# Patient Record
Sex: Female | Born: 1945 | Race: White | Hispanic: No | Marital: Married | Smoking: Former smoker
Health system: Southern US, Community
[De-identification: ages and names within clinical notes are randomized; demographics above are authoritative.]

## PROBLEM LIST (undated history)

## (undated) DIAGNOSIS — Z8619 Personal history of other infectious and parasitic diseases: Secondary | ICD-10-CM

## (undated) DIAGNOSIS — I340 Nonrheumatic mitral (valve) insufficiency: Secondary | ICD-10-CM

## (undated) DIAGNOSIS — Z8719 Personal history of other diseases of the digestive system: Secondary | ICD-10-CM

## (undated) DIAGNOSIS — J449 Chronic obstructive pulmonary disease, unspecified: Secondary | ICD-10-CM

## (undated) DIAGNOSIS — K219 Gastro-esophageal reflux disease without esophagitis: Secondary | ICD-10-CM

## (undated) DIAGNOSIS — I447 Left bundle-branch block, unspecified: Secondary | ICD-10-CM

## (undated) DIAGNOSIS — K5790 Diverticulosis of intestine, part unspecified, without perforation or abscess without bleeding: Secondary | ICD-10-CM

## (undated) DIAGNOSIS — E785 Hyperlipidemia, unspecified: Secondary | ICD-10-CM

## (undated) DIAGNOSIS — D649 Anemia, unspecified: Secondary | ICD-10-CM

## (undated) DIAGNOSIS — K649 Unspecified hemorrhoids: Secondary | ICD-10-CM

## (undated) DIAGNOSIS — M1990 Unspecified osteoarthritis, unspecified site: Secondary | ICD-10-CM

## (undated) DIAGNOSIS — I429 Cardiomyopathy, unspecified: Secondary | ICD-10-CM

## (undated) DIAGNOSIS — F329 Major depressive disorder, single episode, unspecified: Secondary | ICD-10-CM

## (undated) DIAGNOSIS — I341 Nonrheumatic mitral (valve) prolapse: Secondary | ICD-10-CM

## (undated) DIAGNOSIS — A0472 Enterocolitis due to Clostridium difficile, not specified as recurrent: Secondary | ICD-10-CM

## (undated) DIAGNOSIS — F32A Depression, unspecified: Secondary | ICD-10-CM

## (undated) DIAGNOSIS — R091 Pleurisy: Secondary | ICD-10-CM

## (undated) DIAGNOSIS — I219 Acute myocardial infarction, unspecified: Secondary | ICD-10-CM

## (undated) DIAGNOSIS — I639 Cerebral infarction, unspecified: Secondary | ICD-10-CM

## (undated) DIAGNOSIS — K2960 Other gastritis without bleeding: Secondary | ICD-10-CM

## (undated) HISTORY — PX: DILATION AND CURETTAGE OF UTERUS: SHX78

## (undated) HISTORY — PX: CHOLECYSTECTOMY: SHX55

## (undated) HISTORY — PX: BREAST SURGERY: SHX581

## (undated) HISTORY — PX: COLONOSCOPY: SHX174

## (undated) SURGERY — Surgical Case
Anesthesia: *Unknown

---

## 1994-11-09 DIAGNOSIS — A0472 Enterocolitis due to Clostridium difficile, not specified as recurrent: Secondary | ICD-10-CM

## 1994-11-09 HISTORY — DX: Enterocolitis due to Clostridium difficile, not specified as recurrent: A04.72

## 2004-08-12 ENCOUNTER — Emergency Department: Payer: Self-pay | Admitting: Emergency Medicine

## 2004-08-12 ENCOUNTER — Other Ambulatory Visit: Payer: Self-pay

## 2004-08-13 ENCOUNTER — Ambulatory Visit: Payer: Self-pay | Admitting: Emergency Medicine

## 2004-08-27 ENCOUNTER — Ambulatory Visit: Payer: Self-pay | Admitting: Surgery

## 2005-07-31 ENCOUNTER — Ambulatory Visit: Payer: Self-pay | Admitting: Internal Medicine

## 2005-12-19 ENCOUNTER — Other Ambulatory Visit: Payer: Self-pay

## 2005-12-19 ENCOUNTER — Emergency Department: Payer: Self-pay | Admitting: Emergency Medicine

## 2005-12-21 ENCOUNTER — Ambulatory Visit: Payer: Self-pay | Admitting: Emergency Medicine

## 2006-02-18 ENCOUNTER — Ambulatory Visit: Payer: Self-pay

## 2007-09-29 ENCOUNTER — Ambulatory Visit: Payer: Self-pay | Admitting: Gastroenterology

## 2007-10-17 ENCOUNTER — Emergency Department: Payer: Self-pay | Admitting: Emergency Medicine

## 2007-10-17 ENCOUNTER — Other Ambulatory Visit: Payer: Self-pay

## 2007-11-08 ENCOUNTER — Ambulatory Visit: Payer: Self-pay | Admitting: Gastroenterology

## 2007-12-12 ENCOUNTER — Ambulatory Visit: Payer: Self-pay | Admitting: Internal Medicine

## 2007-12-22 ENCOUNTER — Ambulatory Visit: Payer: Self-pay | Admitting: Surgery

## 2008-12-16 IMAGING — RF DG UGI W/O KUB
1 series · 15 of 24 positions shown · non-contrast
Comparison: none

REASON FOR EXAM: reflux    eval hiatal hernia
COMMENTS:

[Series 1: run · 15 of 29 slices shown]
[im 1/29]
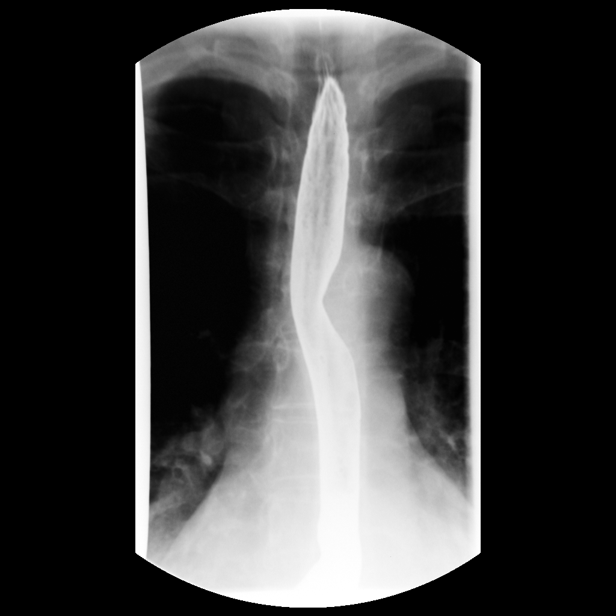
[im 3/29]
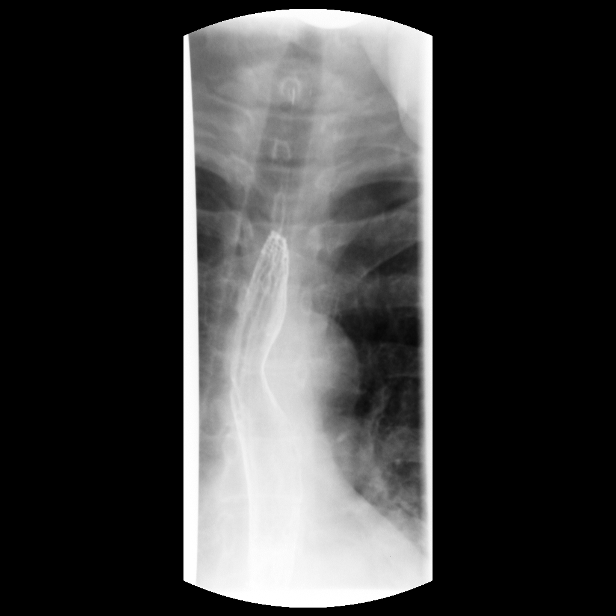
[im 5/29]
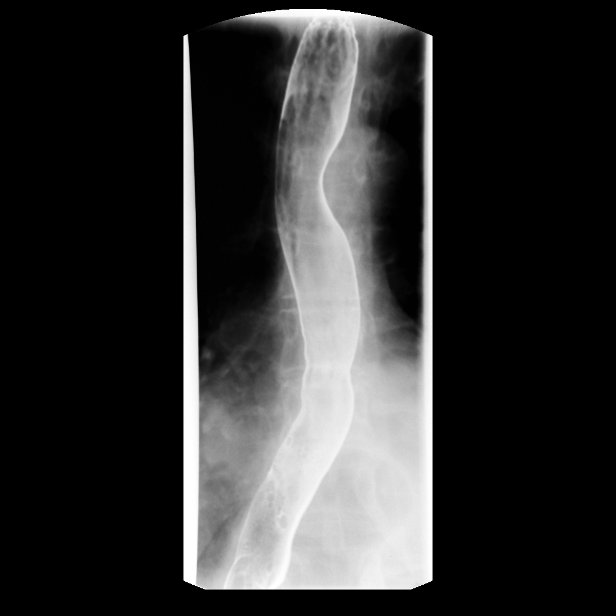
[im 7/29]
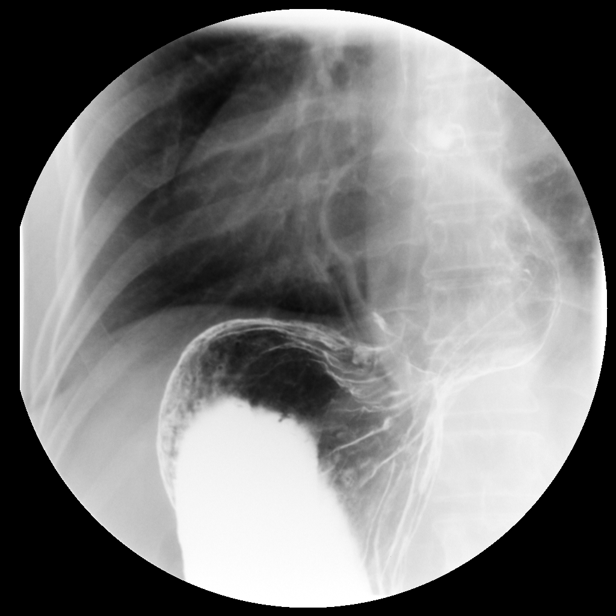
[im 9/29]
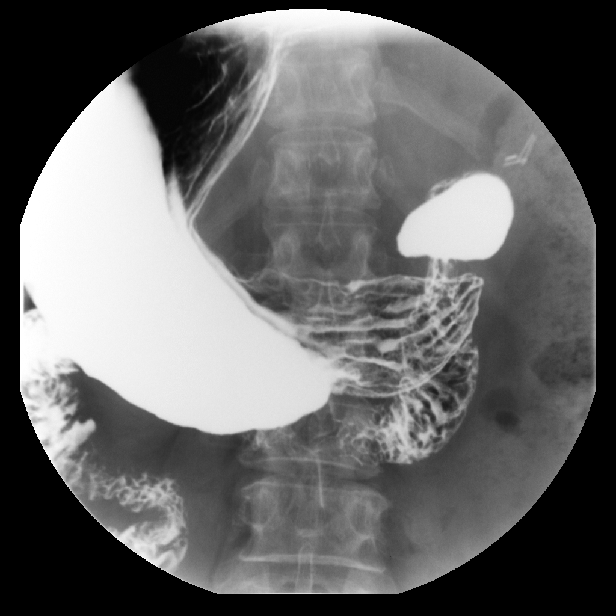
[im 10/29]
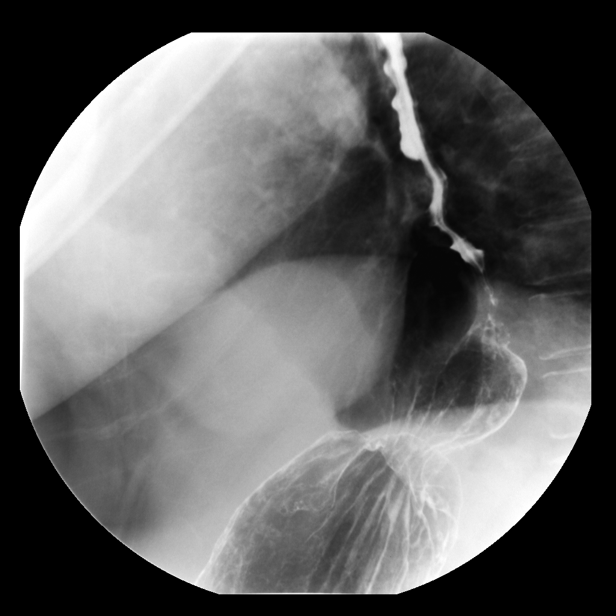
[im 13/29]
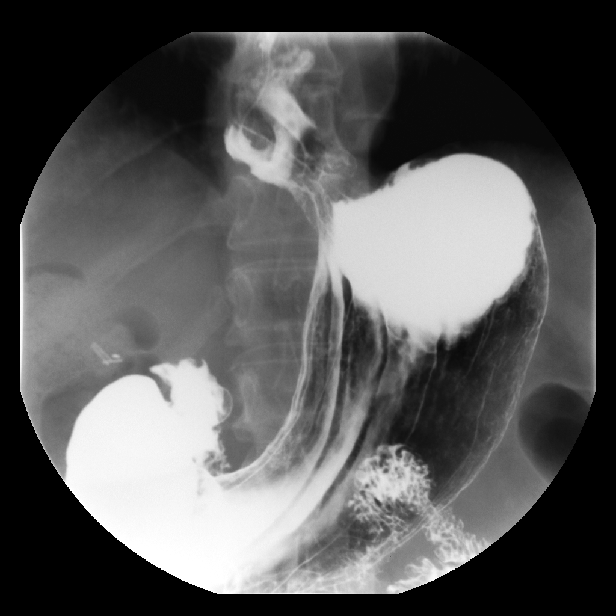
[im 15/29]
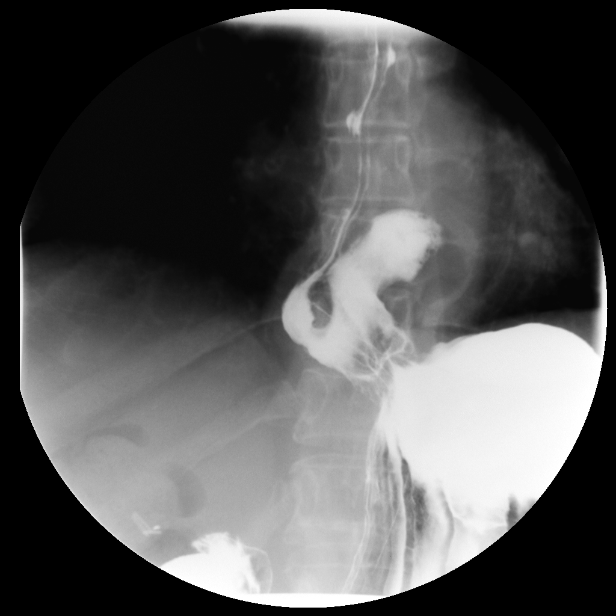
[im 16/29]
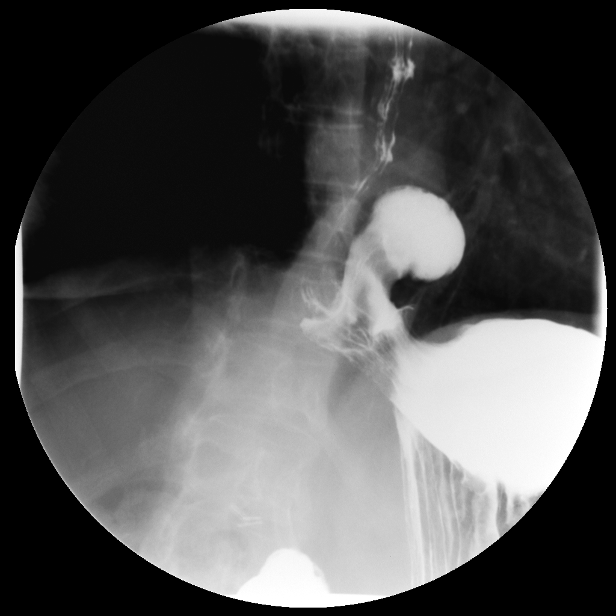
[im 19/29]
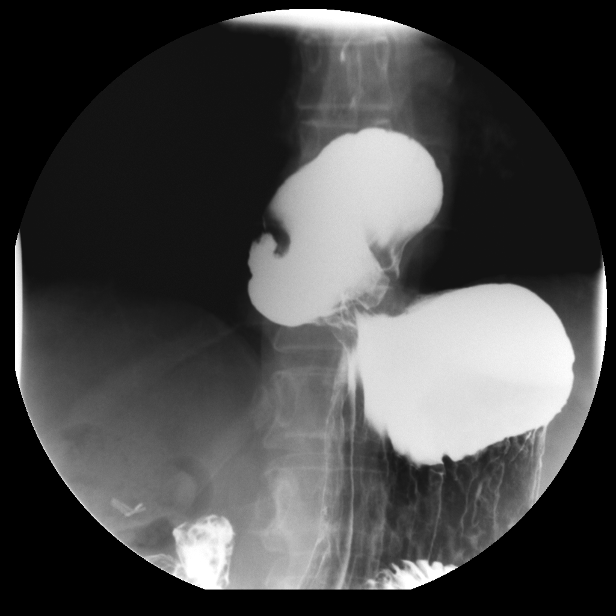
[im 20/29]
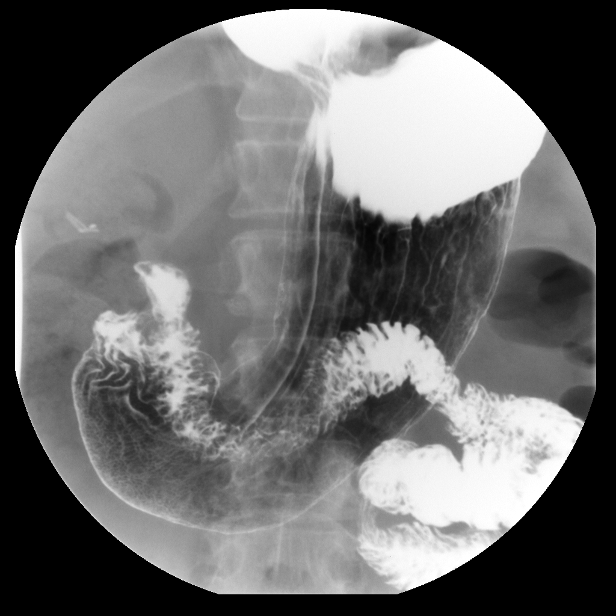
[im 22/29]
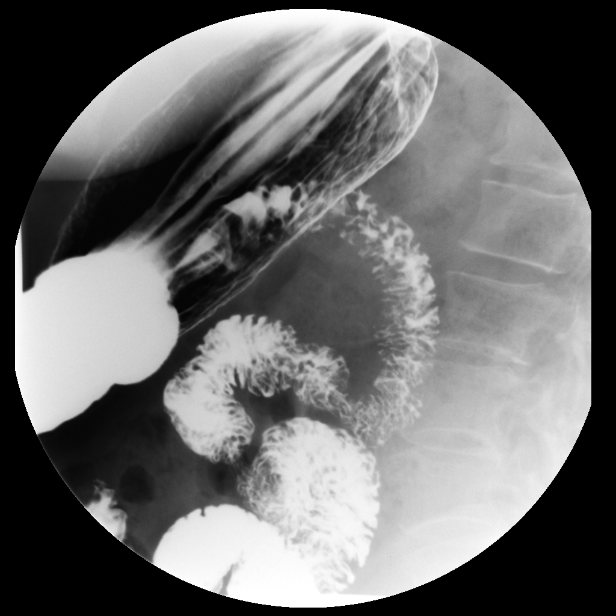
[im 25/29]
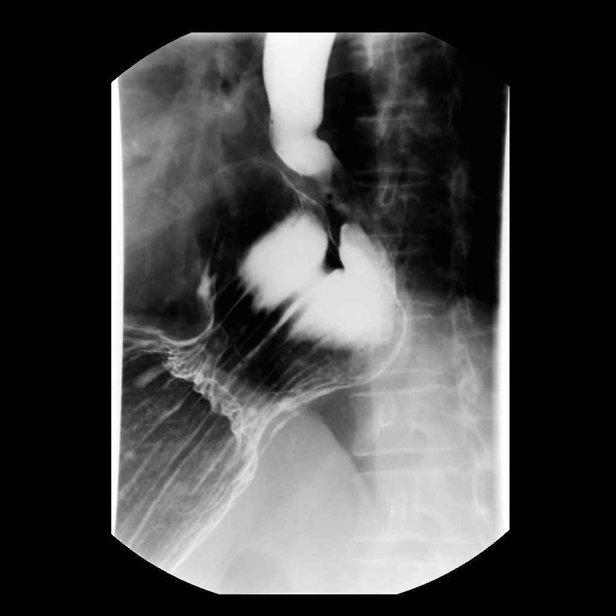
[im 26/29]
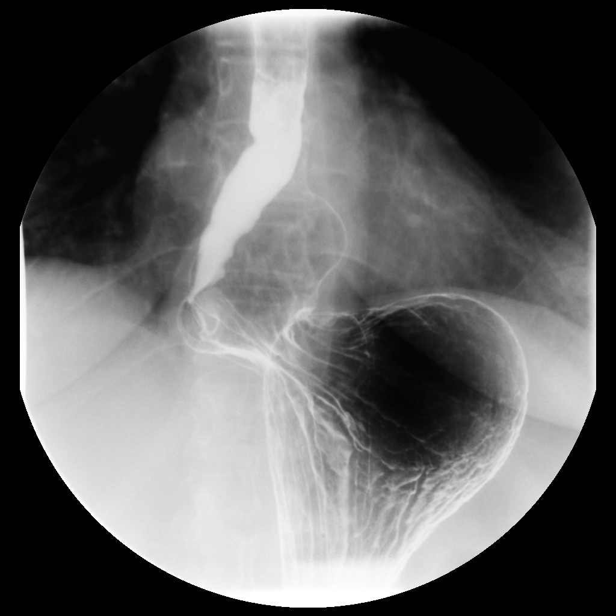
[im 29/29]
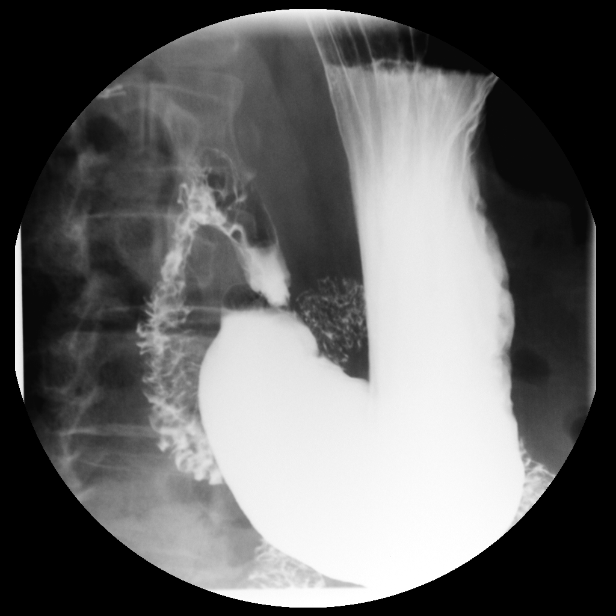

[15 of 24 positions shown; findings below may reference images not displayed]

PROCEDURE:     FL  - FL UPPER GI  - December 22, 2007  [DATE]

RESULT:     Double-contrast upper GI examination is performed. The patient
has had a recent barium swallow exam which demonstrated a paraesophageal
hernia. Today's examination again demonstrates herniation of the fundus
through the diaphragmatic hiatus into a paraesophageal location. There is
minimal gastroesophageal reflux into the distal esophagus. The gastric
mucosa and esophageal mucosa appear to be normal. The proximal small bowel
is unremarkable.
IMPRESSION: Paraesophageal hernia as described previously.

## 2009-11-17 ENCOUNTER — Emergency Department: Payer: Self-pay | Admitting: Emergency Medicine

## 2009-11-21 ENCOUNTER — Ambulatory Visit: Payer: Self-pay | Admitting: Gastroenterology

## 2009-11-25 ENCOUNTER — Emergency Department: Payer: Self-pay

## 2010-04-29 ENCOUNTER — Ambulatory Visit: Payer: Self-pay

## 2010-05-06 ENCOUNTER — Emergency Department: Payer: Self-pay | Admitting: Emergency Medicine

## 2010-07-24 ENCOUNTER — Ambulatory Visit: Payer: Self-pay | Admitting: Neurology

## 2011-01-02 ENCOUNTER — Emergency Department: Payer: Self-pay | Admitting: Emergency Medicine

## 2011-05-01 ENCOUNTER — Ambulatory Visit: Payer: Self-pay | Admitting: Rheumatology

## 2011-05-01 IMAGING — CR DG CHEST 2V
1 series · 2 of 2 positions shown · non-contrast
Comparison: none

REASON FOR EXAM: SOB
COMMENTS:

PROCEDURE:     DXR - DXR CHEST PA (OR AP) AND LATERAL  - May 06, 2010  [DATE]
RESULT:     No acute cardiopulmonary disease noted. Sliding hiatal hernia is
present.

[Series 1: view not recorded · 0.17mm/px · 2 of 2 slices shown]
[im 1/2]
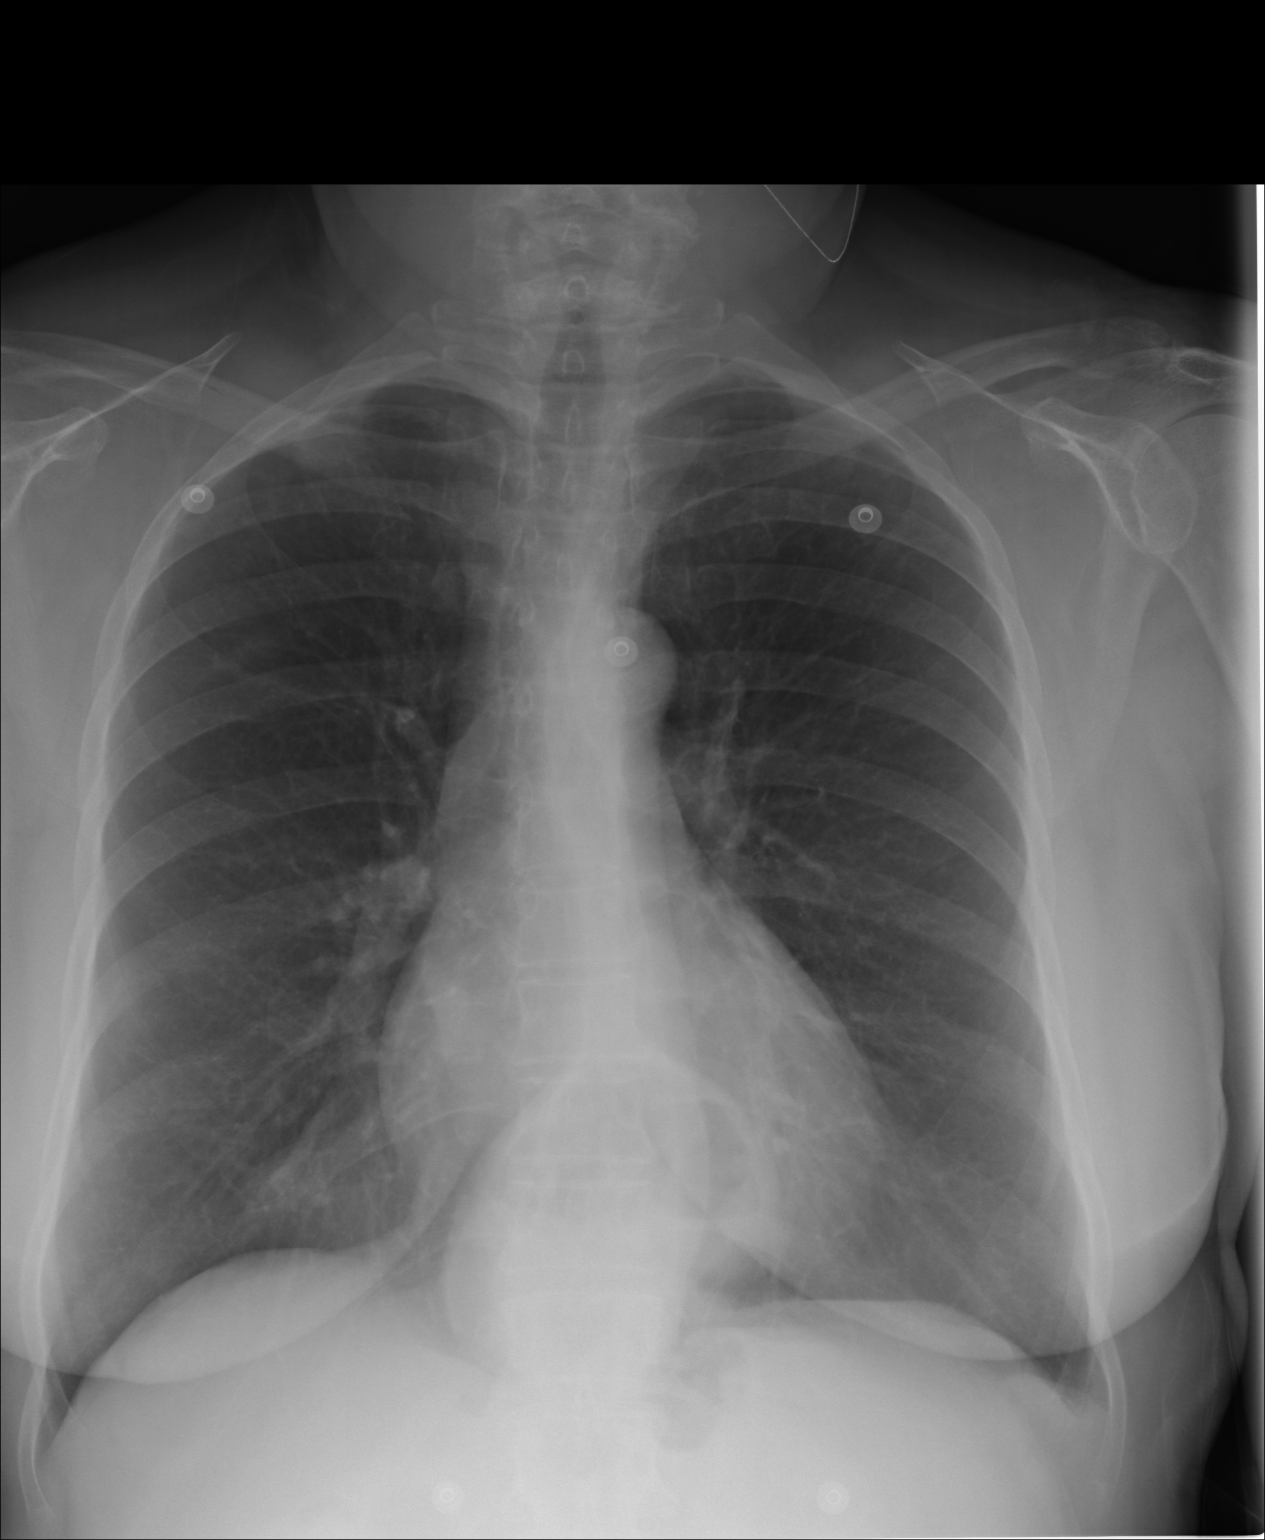
[im 2/2]
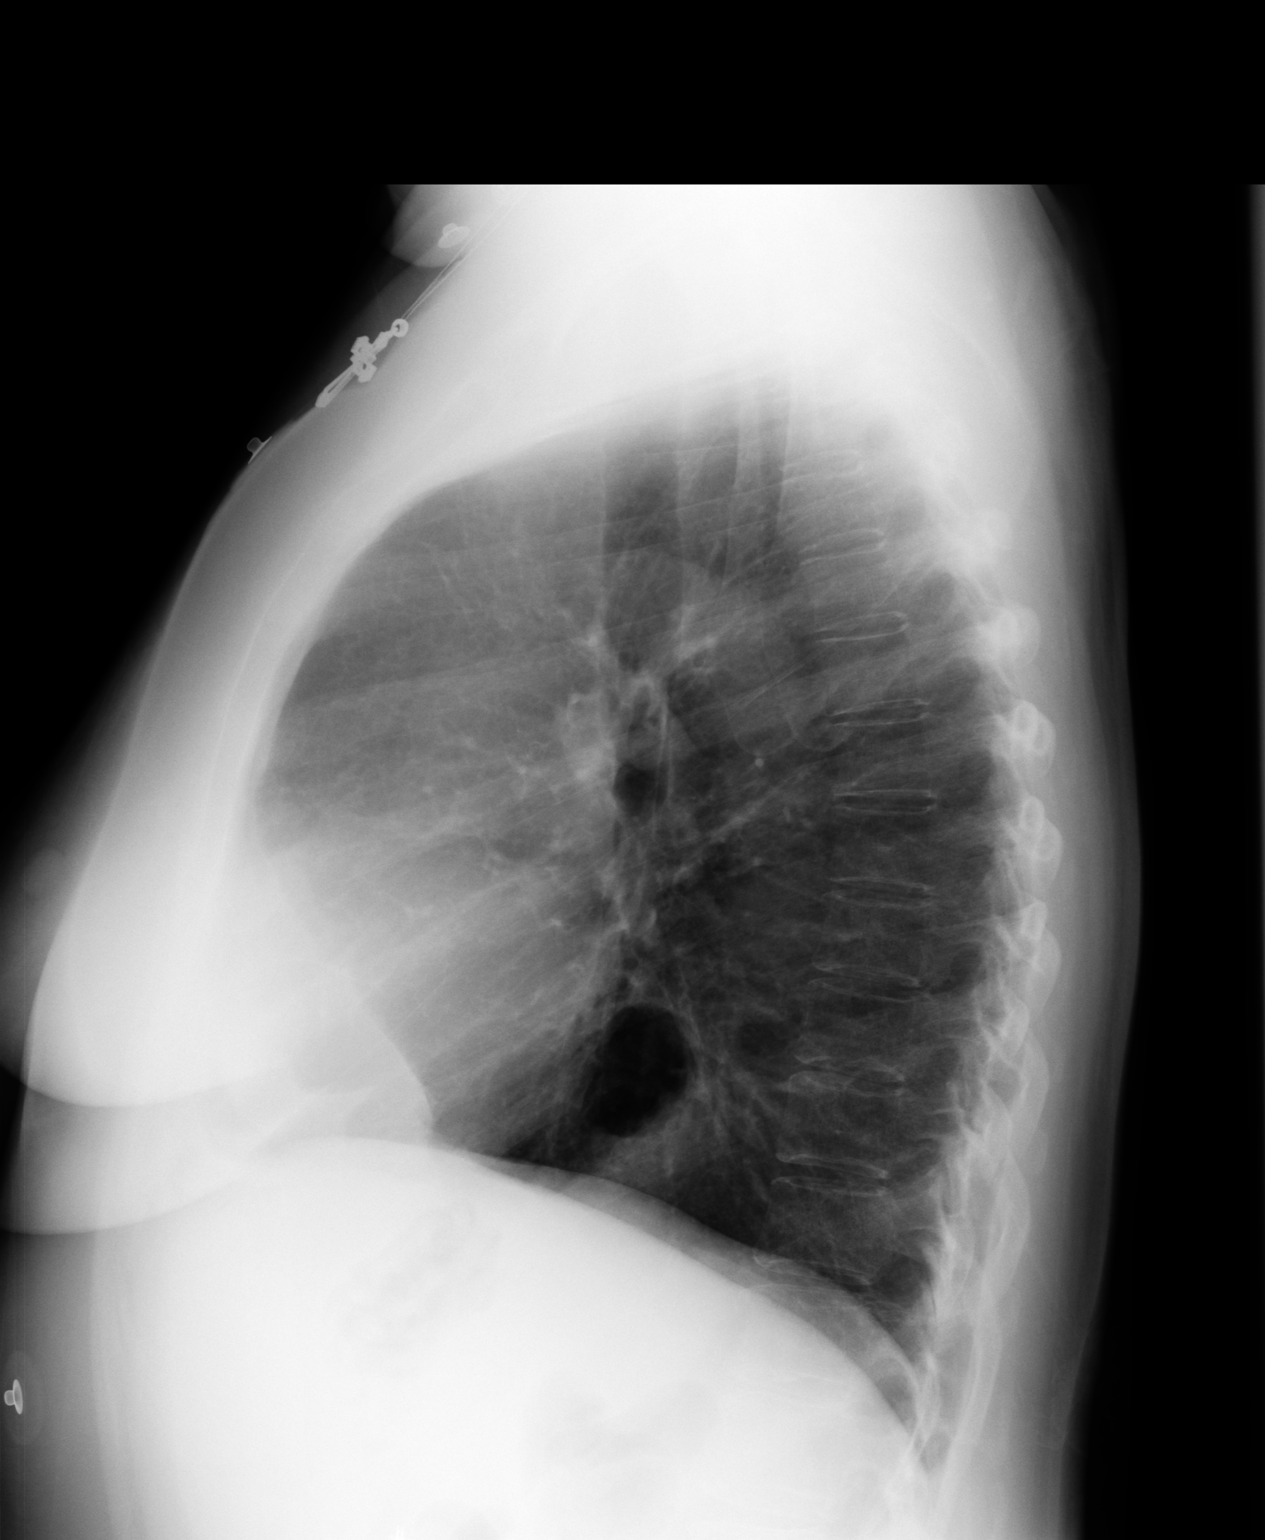

[2 of 2 positions shown; findings below may reference images not displayed]

IMPRESSION: Sliding hiatal hernia. No acute abnormality.

## 2011-07-23 ENCOUNTER — Ambulatory Visit: Payer: Self-pay | Admitting: Unknown Physician Specialty

## 2011-07-27 ENCOUNTER — Ambulatory Visit: Payer: Self-pay | Admitting: Unknown Physician Specialty

## 2011-07-27 HISTORY — PX: SHOULDER ARTHROSCOPY WITH ROTATOR CUFF REPAIR: SHX5685

## 2011-12-25 ENCOUNTER — Other Ambulatory Visit: Payer: Self-pay | Admitting: Gastroenterology

## 2011-12-25 LAB — CLOSTRIDIUM DIFFICILE BY PCR

## 2011-12-26 LAB — WBCS, STOOL

## 2011-12-28 LAB — STOOL CULTURE

## 2012-05-06 ENCOUNTER — Ambulatory Visit: Payer: Self-pay | Admitting: Internal Medicine

## 2012-10-12 ENCOUNTER — Ambulatory Visit: Payer: Self-pay | Admitting: Internal Medicine

## 2014-08-10 ENCOUNTER — Ambulatory Visit: Payer: Self-pay | Admitting: Unknown Physician Specialty

## 2014-11-07 ENCOUNTER — Ambulatory Visit: Payer: Self-pay | Admitting: Neurology

## 2015-01-14 ENCOUNTER — Ambulatory Visit: Payer: Self-pay | Admitting: Gastroenterology

## 2015-01-14 DIAGNOSIS — K2960 Other gastritis without bleeding: Secondary | ICD-10-CM

## 2015-01-14 HISTORY — DX: Other gastritis without bleeding: K29.60

## 2015-03-04 LAB — SURGICAL PATHOLOGY

## 2015-03-14 ENCOUNTER — Encounter: Admission: RE | Payer: Self-pay | Source: Ambulatory Visit

## 2015-03-14 ENCOUNTER — Ambulatory Visit
Admission: RE | Admit: 2015-03-14 | Payer: Federal, State, Local not specified - PPO | Source: Ambulatory Visit | Admitting: Gastroenterology

## 2015-03-14 SURGERY — UPPER ENDOSCOPIC ULTRASOUND (EUS) RADIAL
Anesthesia: General

## 2015-06-04 ENCOUNTER — Other Ambulatory Visit: Payer: Self-pay | Admitting: Neurology

## 2015-06-04 DIAGNOSIS — R2689 Other abnormalities of gait and mobility: Secondary | ICD-10-CM

## 2015-06-07 ENCOUNTER — Ambulatory Visit: Payer: Federal, State, Local not specified - PPO | Attending: Neurology

## 2015-12-06 ENCOUNTER — Encounter: Payer: Self-pay | Admitting: *Deleted

## 2015-12-09 ENCOUNTER — Encounter
Admission: RE | Disposition: A | Discharge status: Home / Self Care | Payer: Self-pay | Source: Ambulatory Visit | Attending: Gastroenterology

## 2015-12-09 ENCOUNTER — Other Ambulatory Visit: Payer: Self-pay | Admitting: Gastroenterology

## 2015-12-09 ENCOUNTER — Encounter: Payer: Self-pay | Admitting: Anesthesiology

## 2015-12-09 ENCOUNTER — Ambulatory Visit: Payer: Federal, State, Local not specified - PPO | Admitting: Anesthesiology

## 2015-12-09 ENCOUNTER — Ambulatory Visit
Admission: RE | Admit: 2015-12-09 | Discharge: 2015-12-09 | Disposition: A | Discharge status: Home / Self Care | Payer: Federal, State, Local not specified - PPO | Source: Ambulatory Visit | Attending: Gastroenterology | Admitting: Gastroenterology

## 2015-12-09 DIAGNOSIS — Z8673 Personal history of transient ischemic attack (TIA), and cerebral infarction without residual deficits: Secondary | ICD-10-CM | POA: Insufficient documentation

## 2015-12-09 DIAGNOSIS — K219 Gastro-esophageal reflux disease without esophagitis: Secondary | ICD-10-CM | POA: Insufficient documentation

## 2015-12-09 DIAGNOSIS — E785 Hyperlipidemia, unspecified: Secondary | ICD-10-CM | POA: Diagnosis not present

## 2015-12-09 DIAGNOSIS — K449 Diaphragmatic hernia without obstruction or gangrene: Secondary | ICD-10-CM

## 2015-12-09 DIAGNOSIS — Z79899 Other long term (current) drug therapy: Secondary | ICD-10-CM | POA: Diagnosis not present

## 2015-12-09 DIAGNOSIS — K317 Polyp of stomach and duodenum: Secondary | ICD-10-CM | POA: Insufficient documentation

## 2015-12-09 DIAGNOSIS — K2950 Unspecified chronic gastritis without bleeding: Secondary | ICD-10-CM | POA: Insufficient documentation

## 2015-12-09 DIAGNOSIS — D649 Anemia, unspecified: Secondary | ICD-10-CM | POA: Diagnosis not present

## 2015-12-09 DIAGNOSIS — I341 Nonrheumatic mitral (valve) prolapse: Secondary | ICD-10-CM | POA: Insufficient documentation

## 2015-12-09 DIAGNOSIS — K921 Melena: Secondary | ICD-10-CM | POA: Insufficient documentation

## 2015-12-09 DIAGNOSIS — I252 Old myocardial infarction: Secondary | ICD-10-CM | POA: Insufficient documentation

## 2015-12-09 DIAGNOSIS — J449 Chronic obstructive pulmonary disease, unspecified: Secondary | ICD-10-CM | POA: Insufficient documentation

## 2015-12-09 DIAGNOSIS — I10 Essential (primary) hypertension: Secondary | ICD-10-CM | POA: Insufficient documentation

## 2015-12-09 DIAGNOSIS — F329 Major depressive disorder, single episode, unspecified: Secondary | ICD-10-CM | POA: Diagnosis not present

## 2015-12-09 DIAGNOSIS — M1990 Unspecified osteoarthritis, unspecified site: Secondary | ICD-10-CM | POA: Diagnosis not present

## 2015-12-09 DIAGNOSIS — Z880 Allergy status to penicillin: Secondary | ICD-10-CM | POA: Insufficient documentation

## 2015-12-09 HISTORY — DX: Nonrheumatic mitral (valve) insufficiency: I34.0

## 2015-12-09 HISTORY — DX: Chronic obstructive pulmonary disease, unspecified: J44.9

## 2015-12-09 HISTORY — DX: Unspecified hemorrhoids: K64.9

## 2015-12-09 HISTORY — DX: Cardiomyopathy, unspecified: I42.9

## 2015-12-09 HISTORY — DX: Hyperlipidemia, unspecified: E78.5

## 2015-12-09 HISTORY — DX: Personal history of other infectious and parasitic diseases: Z86.19

## 2015-12-09 HISTORY — DX: Pleurisy: R09.1

## 2015-12-09 HISTORY — DX: Depression, unspecified: F32.A

## 2015-12-09 HISTORY — DX: Enterocolitis due to Clostridium difficile, not specified as recurrent: A04.72

## 2015-12-09 HISTORY — DX: Anemia, unspecified: D64.9

## 2015-12-09 HISTORY — DX: Cerebral infarction, unspecified: I63.9

## 2015-12-09 HISTORY — DX: Acute myocardial infarction, unspecified: I21.9

## 2015-12-09 HISTORY — DX: Personal history of other diseases of the digestive system: Z87.19

## 2015-12-09 HISTORY — PX: ESOPHAGOGASTRODUODENOSCOPY (EGD) WITH PROPOFOL: SHX5813

## 2015-12-09 HISTORY — DX: Unspecified osteoarthritis, unspecified site: M19.90

## 2015-12-09 HISTORY — DX: Major depressive disorder, single episode, unspecified: F32.9

## 2015-12-09 HISTORY — DX: Gastro-esophageal reflux disease without esophagitis: K21.9

## 2015-12-09 HISTORY — DX: Diverticulosis of intestine, part unspecified, without perforation or abscess without bleeding: K57.90

## 2015-12-09 HISTORY — DX: Other gastritis without bleeding: K29.60

## 2015-12-09 HISTORY — DX: Left bundle-branch block, unspecified: I44.7

## 2015-12-09 HISTORY — DX: Nonrheumatic mitral (valve) prolapse: I34.1

## 2015-12-09 SURGERY — ESOPHAGOGASTRODUODENOSCOPY (EGD) WITH PROPOFOL
Anesthesia: General

## 2015-12-09 MED ORDER — ALFENTANIL 500 MCG/ML IJ INJ
INJECTION | INTRAMUSCULAR | Status: DC | PRN
  Administered 2015-12-09: 500 ug via INTRAVENOUS

## 2015-12-09 MED ORDER — PROPOFOL 500 MG/50ML IV EMUL
INTRAVENOUS | Status: DC | PRN
  Administered 2015-12-09: 100 ug/kg/min via INTRAVENOUS

## 2015-12-09 MED ORDER — SODIUM CHLORIDE 0.9 % IV SOLN
INTRAVENOUS | Status: DC
Start: 2015-12-09 — End: 2015-12-09
  Administered 2015-12-09: 15:00:00 via INTRAVENOUS

## 2015-12-09 MED ORDER — PROPOFOL 10 MG/ML IV BOLUS
INTRAVENOUS | Status: DC | PRN
  Administered 2015-12-09: 100 mg via INTRAVENOUS

## 2015-12-09 MED ORDER — SODIUM CHLORIDE 0.9 % IV SOLN: INTRAVENOUS | Status: DC

## 2015-12-09 NOTE — Anesthesia Preprocedure Evaluation (Addendum)
Anesthesia Evaluation  Patient identified by MRN, date of birth, ID band Patient awake    Reviewed: Allergy & Precautions, NPO status , Patient's Chart, lab work & pertinent test results, reviewed documented beta blocker date and time   Airway Mallampati: II  TM Distance: >3 FB     Dental  (+) Chipped   Pulmonary COPD, former smoker,           Cardiovascular hypertension, Pt. on medications and Pt. on home beta blockers + Past MI  + dysrhythmias      Neuro/Psych PSYCHIATRIC DISORDERS Depression    GI/Hepatic hiatal hernia, PUD, GERD  ,  Endo/Other    Renal/GU      Musculoskeletal  (+) Arthritis ,   Abdominal   Peds  Hematology  (+) anemia ,   Anesthesia Other Findings Denies having a stroke.  Reproductive/Obstetrics                          Anesthesia Physical Anesthesia Plan  ASA: III  Anesthesia Plan: General   Post-op Pain Management:    Induction: Intravenous  Airway Management Planned: Nasal Cannula  Additional Equipment:   Intra-op Plan:   Post-operative Plan:   Informed Consent: I have reviewed the patients History and Physical, chart, labs and discussed the procedure including the risks, benefits and alternatives for the proposed anesthesia with the patient or authorized representative who has indicated his/her understanding and acceptance.     Plan Discussed with: CRNA  Anesthesia Plan Comments:         Anesthesia Quick Evaluation

## 2015-12-09 NOTE — H&P (Signed)
Outpatient short stay form Pre-procedure 12/09/2015 3:01 PM Erica Deem MD  Primary Physician: Dr. Einar Crow  Reason for visit:  EGD  History of present illness:  Patient is a 70 year old female who presented back to the outpatient clinic with a complaint of seeing some black stools. This happened after trip to the [place] about 10 days to 2 weeks ago. This occurred with some watery black stools twice over the course of the day. She has not seen any black stools since then. A hemoglobin was checked which was found to be decreased from previous. He had a "GI bug" she was at the [facility] some nausea vomiting and loose stools. He has gotten over this. She has been continuing on Protonix 40 mg every day. He was seen 3 days ago and arranged for this procedure.    Current facility-administered medications:  .  0.9 %  sodium chloride infusion, , Intravenous, Continuous, Erica Deem, MD, Last Rate: 10 mL/hr at 12/09/15 1440 .  0.9 %  sodium chloride infusion, , Intravenous, Continuous, Erica Deem, MD  Prescriptions prior to admission  Medication Sig Dispense Refill Last Dose  . ALPRAZolam (XANAX) 0.25 MG tablet Take 0.25 mg by mouth 2 (two) times daily.   Past Month at Unknown time  . losartan (COZAAR) 25 MG tablet Take 25 mg by mouth daily.   12/08/2015 at Unknown time  . metoprolol succinate (TOPROL-XL) 25 MG 24 hr tablet Take 25 mg by mouth daily.   12/08/2015 at Unknown time  . pantoprazole (PROTONIX) 40 MG tablet Take 40 mg by mouth daily.   12/08/2015 at Unknown time  . spironolactone (ALDACTONE) 25 MG tablet Take 25 mg by mouth daily.   12/08/2015 at Unknown time     Allergies  Allergen Reactions  . Cleocin [Clindamycin Hcl]   . Erythromycin   . Penicillins   . Serzone [Nefazodone]      Past Medical History  Diagnosis Date  . Anemia   . Cardiomyopathy (HCC)   . History of chickenpox   . COPD (chronic obstructive pulmonary disease) (HCC)   . Depression   .  Diverticulosis   . GERD (gastroesophageal reflux disease)   . Erosive gastritis 01/14/2015  . Myocardial infarction (HCC)   . History of hiatal hernia   . Clostridium difficile colitis 1996  . Hyperlipidemia   . Hemorrhoids   . LBBB (left bundle branch block)   . Mitral valve prolapse   . Mitral insufficiency   . Arthritis   . Pleurisy   . Stroke [facility])     Review of systems:      Physical Exam    Heart and lungs: Regular rate and rhythm without rub or gallop, lungs are bilaterally clear.    HEENT: Normocephalic atraumatic eyes are anicteric    Other:     Pertinant exam for procedure: Soft mild discomfort on the left abdomen. No masses rebound or organomegaly. She is otherwise nontender. I'll sounds positive normoactive.    Planned proceedures: EGD and indicated procedures. I have discussed the risks benefits and complications of procedures to include not limited to bleeding, infection, perforation and the risk of sedation and the patient wishes to proceed.    Erica Deem, MD Gastroenterology 12/09/2015  3:01 PM

## 2015-12-09 NOTE — Op Note (Signed)
[facility] Patient Name: Erica Melendez Procedure Date: 12/09/2015 3:03 PM MRN: [id number] Account #: [id number] Date of Birth: [birth year] Admit Type: Outpatient Age: 70 Room: [facility] ENDO ROOM 3 Gender: Female Note Status: Finalized Procedure:         Upper GI endoscopy Indications:       Melena Providers:         Christena Deem, MD Referring MD:      Marya Amsler. Dareen Piano, MD (Referring MD) Medicines:         Monitored Anesthesia Care Complications:     No immediate complications. Procedure:         Pre-Anesthesia Assessment:                    - ASA Grade Assessment: III - A patient with severe                     systemic disease.                    After obtaining informed consent, the endoscope was passed                     under direct vision. Throughout the procedure, the                     patient's blood pressure, pulse, and oxygen saturations                     were monitored continuously. The Endoscope was introduced                     through the mouth, and advanced to the third part of                     duodenum. The upper GI endoscopy was accomplished without                     difficulty. The upper GI endoscopy was accomplished                     without difficulty. The patient tolerated the procedure                     well. Findings:      The examined esophagus was normal.      A medium-sized paraesophageal hernia was found.      Diffuse minimal inflammation characterized by erythema was found in the       gastric body. Biopsies were taken with a cold forceps for histology.      The examined duodenum was normal.      A single small mucosal papule (nodule) with no bleeding and no stigmata       of recent bleeding was found on the anterior wall of the gastric antrum.       Biopsies were taken with a cold forceps for histology.      A single 2 mm sessile polyp with no bleeding and no stigmata of recent       bleeding  was found in the gastric body. The polyp was removed with a       cold biopsy forceps. Resection and retrieval were complete.      Two 2 mm sessile polyps with no bleeding and no stigmata of recent       bleeding  were found in the cardia. These polyps were removed with a cold       biopsy forceps. Resection and retrieval were complete.      A single 4 mm sessile polyp with no bleeding and no stigmata of recent       bleeding was found in the upper cardia about 1 mm below the Z-line..       Biopsies were taken with a cold forceps for histology.      Retroflexion shows a hiatal hernia, probable paraesophageal. Impression:        - Normal esophagus.                    - Medium-sized paraesophageal hernia.                    - Gastritis. Biopsied.                    - Normal examined duodenum.                    - A single mucosal papule (nodule) found in the stomach.                     Biopsied.                    - A single gastric polyp. Resected and retrieved.                    - Two gastric polyps. Resected and retrieved.                    - A single gastric polyp. Biopsied. Recommendation:    - Discharge patient to home.                    - Use Protonix (pantoprazole) 40 mg PO daily daily.                    - Do an upper GI series at appointment to be scheduled. Procedure Code(s): --- Professional ---                    [phone], Esophagogastroduodenoscopy, flexible, transoral;                     with biopsy, single or multiple CPT copyright 2014 American Medical Association. All rights reserved. The codes documented in this report are preliminary and upon coder review may  be revised to meet current compliance requirements. Christena Deem, MD 12/09/2015 3:54:01 PM This report has been signed electronically. Number of Addenda: 0 Note Initiated On: 12/09/2015 3:03 PM      [facility]

## 2015-12-09 NOTE — Anesthesia Postprocedure Evaluation (Signed)
Anesthesia Post Note  Patient: Erica Melendez  Procedure(s) Performed: Procedure(s) (LRB): ESOPHAGOGASTRODUODENOSCOPY (EGD) WITH PROPOFOL (N/A)  Patient location during evaluation: Endoscopy Anesthesia Type: General Level of consciousness: awake and alert Pain management: pain level controlled Vital Signs Assessment: post-procedure vital signs reviewed and stable Respiratory status: spontaneous breathing, nonlabored ventilation, respiratory function stable and patient connected to nasal cannula oxygen Cardiovascular status: blood pressure returned to baseline and stable Postop Assessment: no signs of nausea or vomiting Anesthetic complications: no    Last Vitals:  Filed Vitals:   12/09/15 1606 12/09/15 1616  BP: 125/78 127/59  Pulse: 78 75  Temp:    Resp: 18 18    Last Pain: There were no vitals filed for this visit.               Lenard Simmer

## 2015-12-09 NOTE — Transfer of Care (Signed)
Immediate Anesthesia Transfer of Care Note  Patient: Erica Melendez  Procedure(s) Performed: Procedure(s): ESOPHAGOGASTRODUODENOSCOPY (EGD) WITH PROPOFOL (N/A)  Patient Location: PACU, SICU and Endoscopy Unit  Anesthesia Type:General  Level of Consciousness: awake, alert  and oriented  Airway & Oxygen Therapy: Patient Spontanous Breathing and Patient connected to nasal cannula oxygen  Post-op Assessment: Report given to RN and Post -op Vital signs reviewed and stable  Post vital signs: Reviewed and stable  Last Vitals:  Filed Vitals:   12/09/15 1421 12/09/15 1546  BP: 131/73 124/65  Pulse: 79   Temp: 36.5 C 36.8 C  Resp: 17 18    Complications: No apparent anesthesia complications

## 2015-12-10 ENCOUNTER — Encounter: Payer: Self-pay | Admitting: Gastroenterology

## 2015-12-11 LAB — SURGICAL PATHOLOGY

## 2015-12-16 ENCOUNTER — Ambulatory Visit: Payer: Federal, State, Local not specified - PPO

## 2015-12-17 ENCOUNTER — Ambulatory Visit: Payer: Federal, State, Local not specified - PPO

## 2015-12-18 ENCOUNTER — Ambulatory Visit
Admission: RE | Admit: 2015-12-18 | Discharge: 2015-12-18 | Disposition: A | Discharge status: Home / Self Care | Payer: Federal, State, Local not specified - PPO | Source: Ambulatory Visit | Attending: Gastroenterology | Admitting: Gastroenterology

## 2015-12-18 DIAGNOSIS — K228 Other specified diseases of esophagus: Secondary | ICD-10-CM | POA: Insufficient documentation

## 2015-12-18 DIAGNOSIS — K449 Diaphragmatic hernia without obstruction or gangrene: Secondary | ICD-10-CM | POA: Insufficient documentation

## 2016-09-09 ENCOUNTER — Other Ambulatory Visit: Payer: Self-pay | Admitting: Neurology

## 2016-09-09 DIAGNOSIS — R42 Dizziness and giddiness: Secondary | ICD-10-CM

## 2016-09-09 DIAGNOSIS — R2689 Other abnormalities of gait and mobility: Secondary | ICD-10-CM

## 2016-09-11 ENCOUNTER — Ambulatory Visit
Admission: RE | Admit: 2016-09-11 | Discharge: 2016-09-11 | Disposition: A | Discharge status: Home / Self Care | Payer: Federal, State, Local not specified - PPO | Source: Ambulatory Visit | Attending: Neurology | Admitting: Neurology

## 2016-09-11 DIAGNOSIS — R42 Dizziness and giddiness: Secondary | ICD-10-CM | POA: Diagnosis present

## 2016-09-11 DIAGNOSIS — M2578 Osteophyte, vertebrae: Secondary | ICD-10-CM | POA: Insufficient documentation

## 2016-09-11 DIAGNOSIS — R2689 Other abnormalities of gait and mobility: Secondary | ICD-10-CM | POA: Insufficient documentation

## 2016-09-11 DIAGNOSIS — M4802 Spinal stenosis, cervical region: Secondary | ICD-10-CM | POA: Diagnosis not present

## 2016-09-11 DIAGNOSIS — E041 Nontoxic single thyroid nodule: Secondary | ICD-10-CM | POA: Insufficient documentation

## 2016-10-13 ENCOUNTER — Ambulatory Visit: Payer: Medicare Other

## 2016-12-12 IMAGING — RF DG UGI W/O KUB
1 series · 12 of 12 positions shown · non-contrast
Comparison: Upper GI series of October 20, 2008

ADDENDUM:
Clinical history received from the patient regarding a tear in the
hiatal hernia may be erroneous or referring to a previous injury.
The small intramural barium collection may be fortuitous and normal
for the patient. If there are clinical symptoms that might indicate
ulceration of this hiatal hernia, this could reflect that entity.
CLINICAL DATA: History of hiatal hernia and reflux, currently on
medication, recent diagnosis of a tear in the hiatal hernia on
endoscopy in Tuesday November, 2015

EXAM:
UPPER GI SERIES WITHOUT KUB
TECHNIQUE: Routine upper GI series was performed with thin/high density barium.
Effervescent crystals and a barium tablet were administered.
FLUOROSCOPY TIME:  Fluoroscopy Time (in minutes and seconds): 1
minutes, 18 seconds
Number of Acquired Images:  12

[Series 1: fluoro_barium 2fps_bw · 0.17mm/px · 12 of 12 slices shown]
[im 1/12]
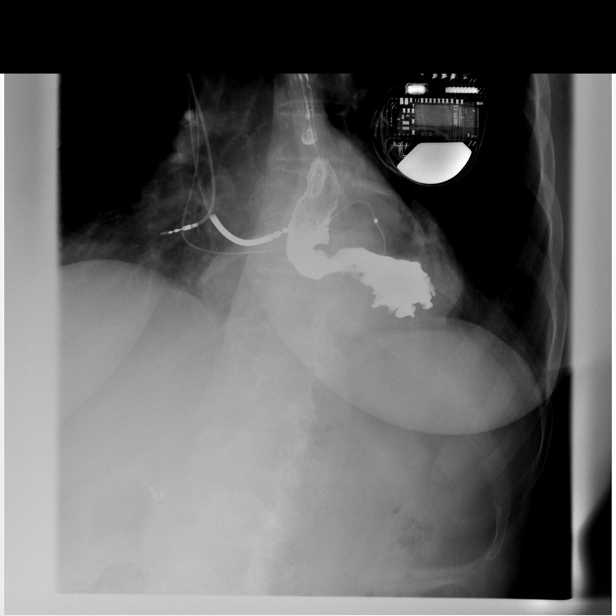
[im 2/12]
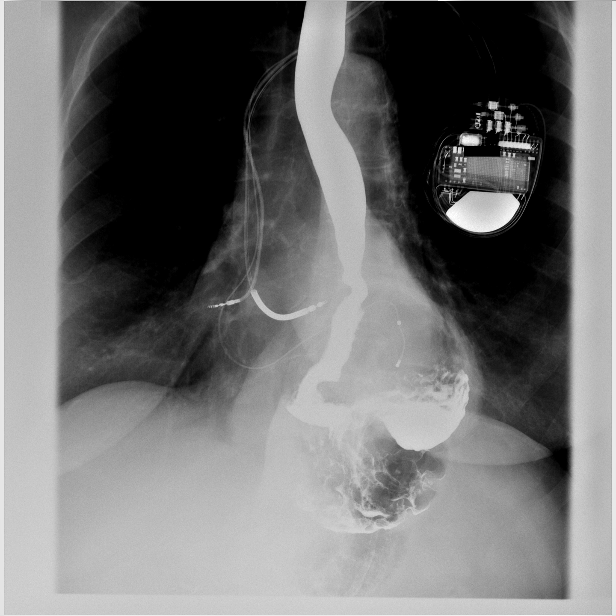
[im 3/12]
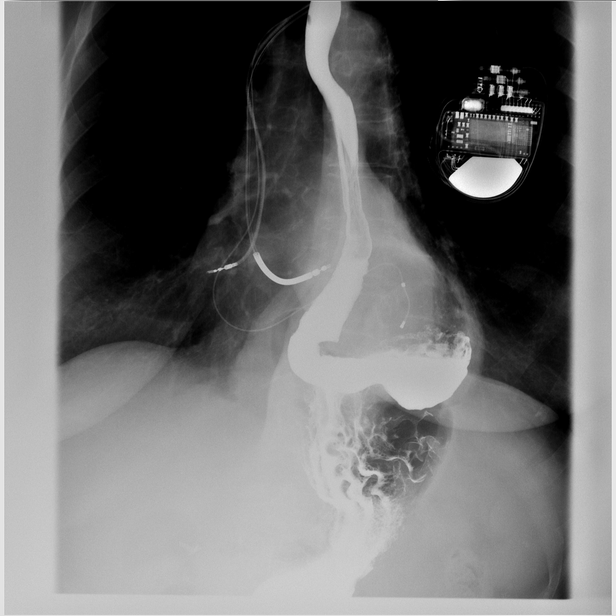
[im 4/12]
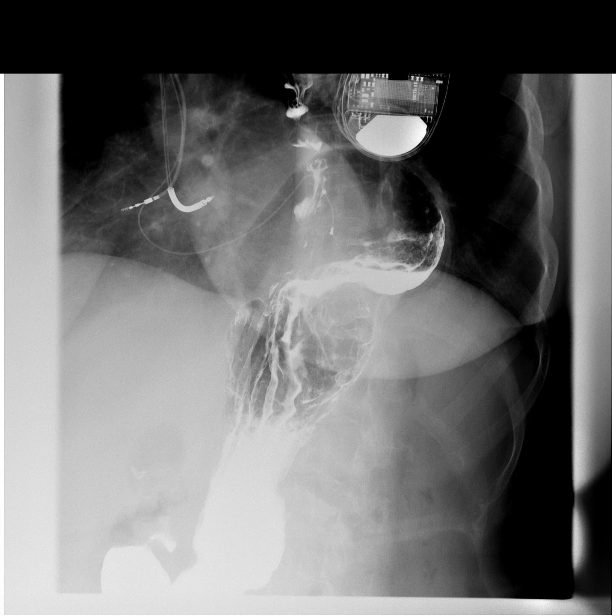
[im 5/12]
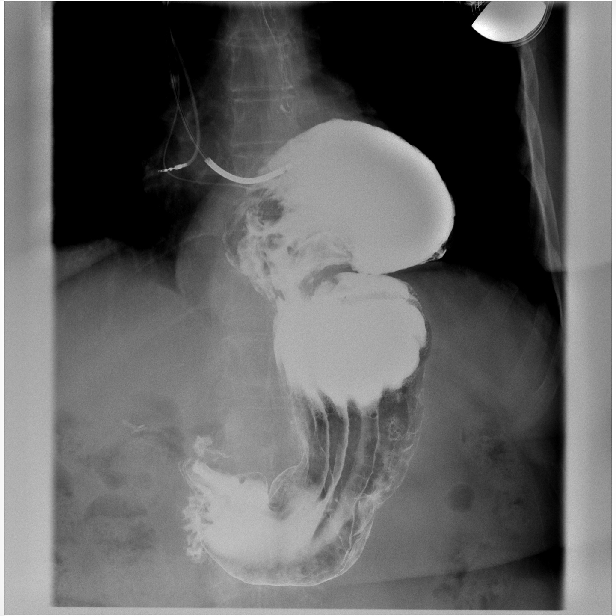
[im 6/12]
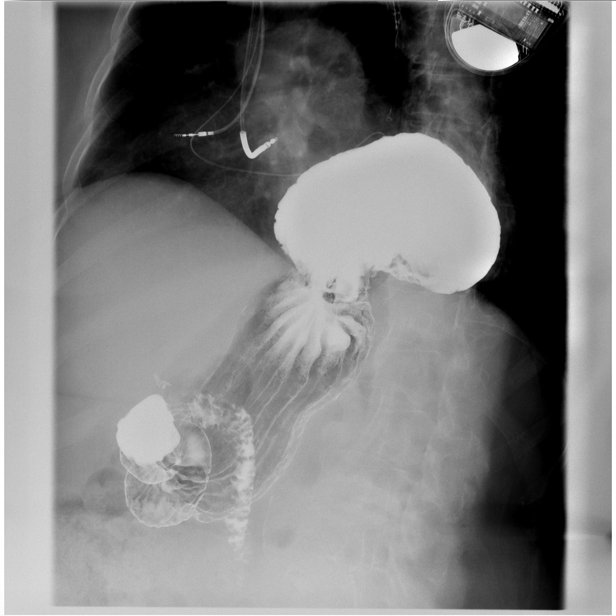
[im 7/12]
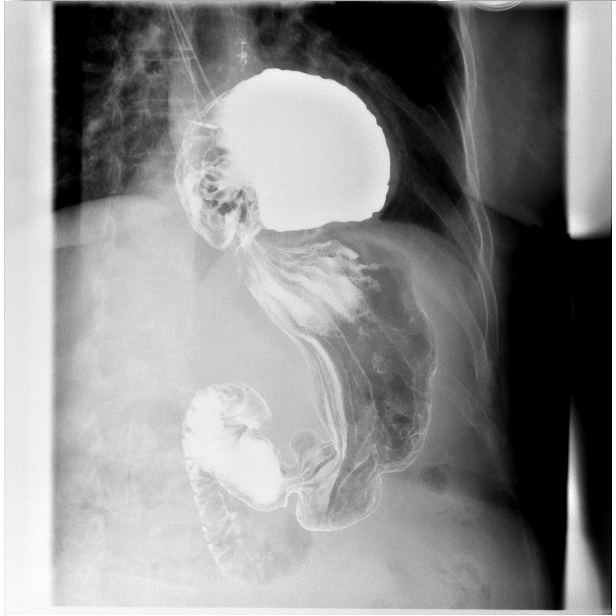
[im 8/12]
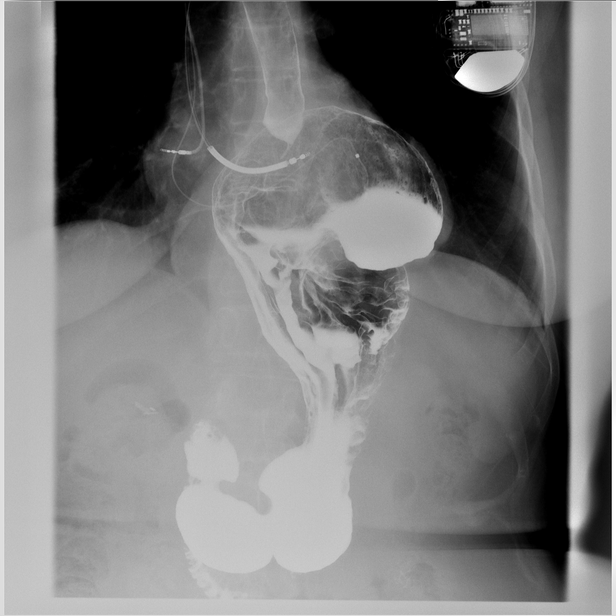
[im 9/12]
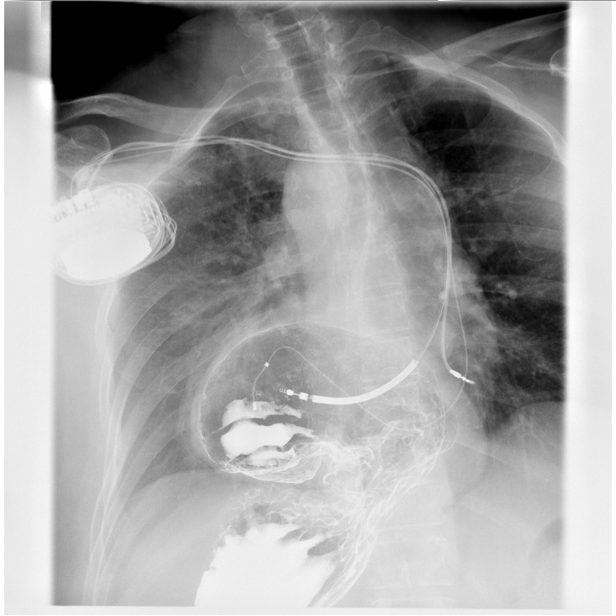
[im 10/12]
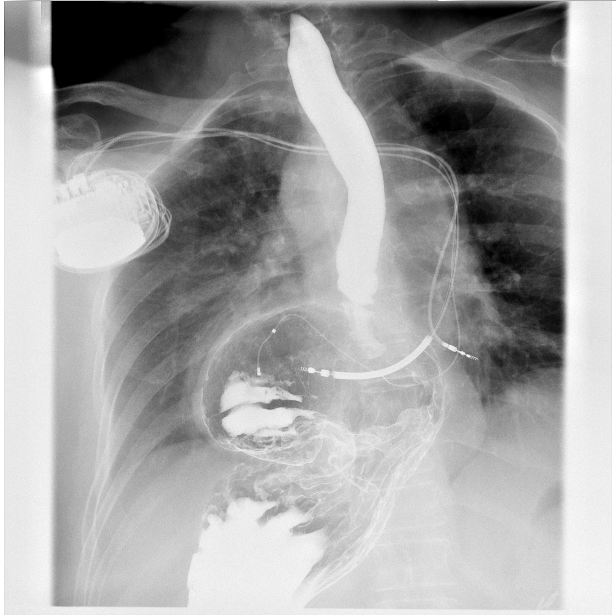
[im 11/12]
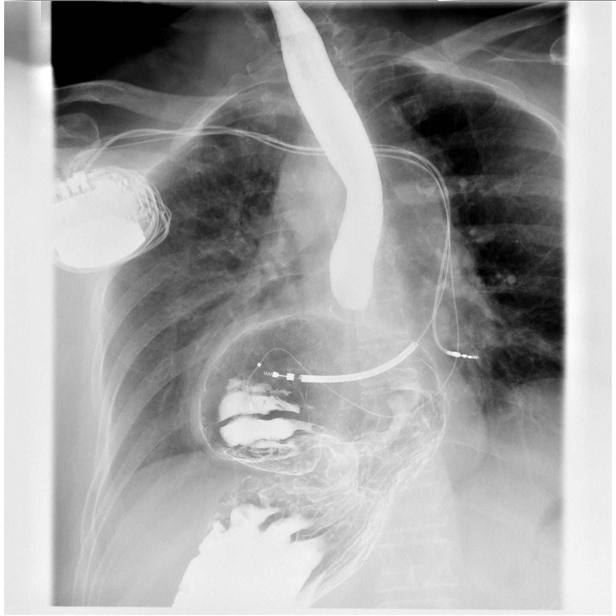
[im 12/12]
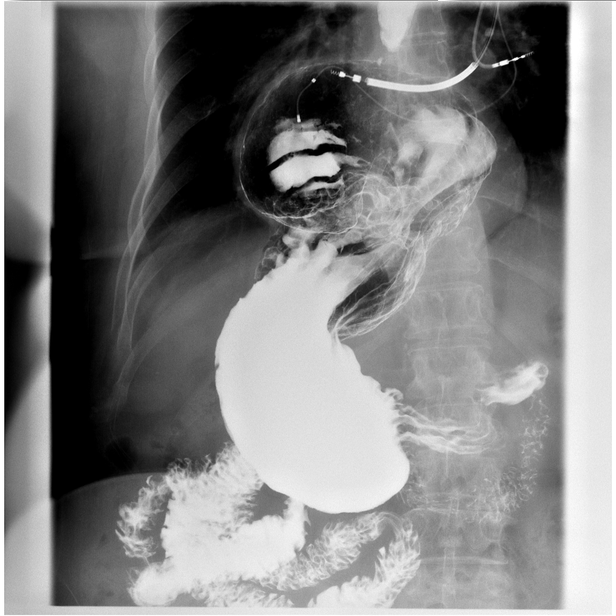

[12 of 12 positions shown; findings below may reference images not displayed]

FINDINGS: The patient ingested the thick and thin barium and the gas-forming
crystals without difficulty. The cervical esophagus was normal in a
survey fashion. The thoracic esophagus distended well. There was
mild transient narrowing at the GE junction. A few tertiary
contractions of the distal third of the esophagus are observed.

There is a large non reducible hiatal hernia-partially intrathoracic
stomach. A tiny intramural barium collection was noted on one view
only that could reflect mild residua of the previous endoscopically
demonstrated tear. The intrathoracic portion of the stomach appears
normal. Gastric emptying is prompt. The barium tablet passes
promptly through the esophagus into the intra thoracic portion of
the stomach.

The duodenal bulb and C-sweep are normal in appearance.
IMPRESSION: 1. Large non reducible hiatal hernia -partially intra thoracic
stomach. A tiny intramural barium collection may reflect the residua
of the tear described on the endoscopy. This is visualized on one
view only and could be normal. No significant mucosal irregularities
are observed elsewhere.
2. Mild changes of presbyesophagus. The barium tablet passes to the
level of the distal esophagus and then into the intra thoracic
portion of the stomach without difficulty.

## 2018-08-04 ENCOUNTER — Emergency Department: Payer: Medicare Other

## 2018-08-04 ENCOUNTER — Inpatient Hospital Stay
Admission: EM | Admit: 2018-08-04 | Discharge: 2018-08-08 | DRG: 470 | Disposition: A | Discharge status: Skilled Nursing Facility | Payer: Medicare Other | Attending: Internal Medicine | Admitting: Internal Medicine

## 2018-08-04 ENCOUNTER — Other Ambulatory Visit: Payer: Self-pay

## 2018-08-04 ENCOUNTER — Encounter: Payer: Self-pay | Admitting: *Deleted

## 2018-08-04 DIAGNOSIS — I255 Ischemic cardiomyopathy: Secondary | ICD-10-CM | POA: Diagnosis present

## 2018-08-04 DIAGNOSIS — I110 Hypertensive heart disease with heart failure: Secondary | ICD-10-CM | POA: Diagnosis present

## 2018-08-04 DIAGNOSIS — W19XXXA Unspecified fall, initial encounter: Secondary | ICD-10-CM

## 2018-08-04 DIAGNOSIS — F419 Anxiety disorder, unspecified: Secondary | ICD-10-CM | POA: Diagnosis present

## 2018-08-04 DIAGNOSIS — I420 Dilated cardiomyopathy: Secondary | ICD-10-CM | POA: Diagnosis present

## 2018-08-04 DIAGNOSIS — R739 Hyperglycemia, unspecified: Secondary | ICD-10-CM | POA: Diagnosis present

## 2018-08-04 DIAGNOSIS — Z95810 Presence of automatic (implantable) cardiac defibrillator: Secondary | ICD-10-CM

## 2018-08-04 DIAGNOSIS — S72011A Unspecified intracapsular fracture of right femur, initial encounter for closed fracture: Principal | ICD-10-CM | POA: Diagnosis present

## 2018-08-04 DIAGNOSIS — Z09 Encounter for follow-up examination after completed treatment for conditions other than malignant neoplasm: Secondary | ICD-10-CM

## 2018-08-04 DIAGNOSIS — E782 Mixed hyperlipidemia: Secondary | ICD-10-CM | POA: Diagnosis present

## 2018-08-04 DIAGNOSIS — W108XXA Fall (on) (from) other stairs and steps, initial encounter: Secondary | ICD-10-CM | POA: Diagnosis present

## 2018-08-04 DIAGNOSIS — S72009A Fracture of unspecified part of neck of unspecified femur, initial encounter for closed fracture: Secondary | ICD-10-CM

## 2018-08-04 DIAGNOSIS — Z87891 Personal history of nicotine dependence: Secondary | ICD-10-CM

## 2018-08-04 DIAGNOSIS — I252 Old myocardial infarction: Secondary | ICD-10-CM

## 2018-08-04 DIAGNOSIS — Z23 Encounter for immunization: Secondary | ICD-10-CM

## 2018-08-04 DIAGNOSIS — I959 Hypotension, unspecified: Secondary | ICD-10-CM | POA: Diagnosis not present

## 2018-08-04 DIAGNOSIS — Y92008 Other place in unspecified non-institutional (private) residence as the place of occurrence of the external cause: Secondary | ICD-10-CM

## 2018-08-04 DIAGNOSIS — Z8673 Personal history of transient ischemic attack (TIA), and cerebral infarction without residual deficits: Secondary | ICD-10-CM

## 2018-08-04 DIAGNOSIS — K219 Gastro-esophageal reflux disease without esophagitis: Secondary | ICD-10-CM | POA: Diagnosis present

## 2018-08-04 DIAGNOSIS — J449 Chronic obstructive pulmonary disease, unspecified: Secondary | ICD-10-CM | POA: Diagnosis present

## 2018-08-04 DIAGNOSIS — I5042 Chronic combined systolic (congestive) and diastolic (congestive) heart failure: Secondary | ICD-10-CM | POA: Diagnosis present

## 2018-08-04 DIAGNOSIS — Z79899 Other long term (current) drug therapy: Secondary | ICD-10-CM

## 2018-08-04 DIAGNOSIS — S72001A Fracture of unspecified part of neck of right femur, initial encounter for closed fracture: Secondary | ICD-10-CM | POA: Diagnosis present

## 2018-08-04 LAB — CBC WITH DIFFERENTIAL/PLATELET
BASOS ABS: 0.1 10*3/uL (ref 0–0.1)
Basophils Relative: 1 %
Eosinophils Absolute: 0.1 10*3/uL (ref 0–0.7)
Eosinophils Relative: 1 %
HEMATOCRIT: 34.2 % — AB (ref 35.0–47.0)
HEMOGLOBIN: 12.1 g/dL (ref 12.0–16.0)
Lymphocytes Relative: 23 %
Lymphs Abs: 2.1 10*3/uL (ref 1.0–3.6)
MCH: 32.5 pg (ref 26.0–34.0)
MCHC: 35.5 g/dL (ref 32.0–36.0)
MCV: 91.6 fL (ref 80.0–100.0)
MONOS PCT: 4 %
Monocytes Absolute: 0.4 10*3/uL (ref 0.2–0.9)
NEUTROS ABS: 6.5 10*3/uL (ref 1.4–6.5)
NEUTROS PCT: 71 %
Platelets: 276 10*3/uL (ref 150–440)
RBC: 3.73 MIL/uL — AB (ref 3.80–5.20)
RDW: 13.7 % (ref 11.5–14.5)
WBC: 9.1 10*3/uL (ref 3.6–11.0)

## 2018-08-04 LAB — BASIC METABOLIC PANEL
ANION GAP: 6 (ref 5–15)
BUN: 13 mg/dL (ref 8–23)
CALCIUM: 9 mg/dL (ref 8.9–10.3)
CO2: 28 mmol/L (ref 22–32)
Chloride: 103 mmol/L (ref 98–111)
Creatinine, Ser: 0.8 mg/dL (ref 0.44–1.00)
Glucose, Bld: 111 mg/dL — ABNORMAL HIGH (ref 70–99)
Potassium: 3.7 mmol/L (ref 3.5–5.1)
Sodium: 137 mmol/L (ref 135–145)

## 2018-08-04 LAB — PROTIME-INR
INR: 0.97
Prothrombin Time: 12.8 seconds (ref 11.4–15.2)

## 2018-08-04 NOTE — ED Triage Notes (Signed)
Pt to triage via wheelchair.  Pt fell inside the home today down 2 steps.  Pt has right hand pain. And right hip pain.  Pt alert.

## 2018-08-04 NOTE — ED Notes (Signed)
Pt states that she fell and she is not able to bear weight on leg. Pt put on bedpan. Tolerated well.

## 2018-08-04 NOTE — ED Provider Notes (Signed)
[facility] Emergency Department Provider Note ____________________________________________   First MD Initiated Contact with Patient 08/04/18 2212     (approximate)  I have reviewed the triage vital signs and the nursing notes.   HISTORY  Chief Complaint Fall    HPI Erica Melendez is a 72 y.o. female with PMH as noted below who presents with right hip injury and right hand pain acute onset after a mechanical fall down 2 steps.  The patient denies hitting her head and denies LOC.  She states that she has been unable to walk or bear weight since the injury.  Past Medical History:  Diagnosis Date  . Anemia   . Arthritis   . Cardiomyopathy (HCC)   . Clostridium difficile colitis 1996  . COPD (chronic obstructive pulmonary disease) (HCC)   . Depression   . Diverticulosis   . Erosive gastritis 01/14/2015  . GERD (gastroesophageal reflux disease)   . Hemorrhoids   . History of chickenpox   . History of hiatal hernia   . Hyperlipidemia   . LBBB (left bundle branch block)   . Mitral insufficiency   . Mitral valve prolapse   . Myocardial infarction (HCC)   . Pleurisy   . Stroke [facility])     There are no active problems to display for this patient.   Past Surgical History:  Procedure Laterality Date  . BREAST SURGERY    . CHOLECYSTECTOMY    . COLONOSCOPY    . DILATION AND CURETTAGE OF UTERUS    . ESOPHAGOGASTRODUODENOSCOPY (EGD) WITH PROPOFOL N/A 12/09/2015   Procedure: ESOPHAGOGASTRODUODENOSCOPY (EGD) WITH PROPOFOL;  Surgeon: Christena Deem, MD;  Location: [facility] ENDOSCOPY;  Service: Endoscopy;  Laterality: N/A;  . SHOULDER ARTHROSCOPY WITH ROTATOR CUFF REPAIR Left 07/27/2011    Prior to Admission medications   Medication Sig Start Date End Date Taking? Authorizing Provider  ALPRAZolam (XANAX) 0.25 MG tablet Take 0.25 mg by mouth 2 (two) times daily.   Yes [provider]  losartan (COZAAR) 25 MG tablet Take 25 mg by mouth daily.    Yes [provider]  metoprolol succinate (TOPROL-XL) 25 MG 24 hr tablet Take 25 mg by mouth daily.   Yes [provider]  pantoprazole (PROTONIX) 40 MG tablet Take 40 mg by mouth daily.   Yes [provider]  spironolactone (ALDACTONE) 25 MG tablet Take 25 mg by mouth daily.   Yes [provider]    Allergies Cleocin [clindamycin hcl]; Erythromycin; Penicillins; and Serzone [nefazodone]  No family history on file.  Social History Social History   Tobacco Use  . Smoking status: Former Smoker    Packs/day: 1.00    Years: 20.00    Pack years: 20.00    Types: Cigarettes    Last attempt to quit: 01/17/1993    Years since quitting: 25.5  . Smokeless tobacco: Never Used  Substance Use Topics  . Alcohol use: No  . Drug use: No    Review of Systems  Constitutional: No fever. Eyes: No redness. ENT: No neck pain. Cardiovascular: Denies chest pain. Respiratory: Denies shortness of breath. Gastrointestinal: No vomiting.  Genitourinary: Negative for flank pain.  Musculoskeletal: Positive for right hip pain. Skin: Negative for abrasions or lacerations. Neurological: Negative for focal weakness or numbness.   ____________________________________________   PHYSICAL EXAM:  VITAL SIGNS: ED Triage Vitals  Enc Vitals Group     BP 08/04/18 2042 111/68     Pulse Rate 08/04/18 2042 79  Resp 08/04/18 2042 20     Temp 08/04/18 2042 98.2 F (36.8 C)     Temp Source 08/04/18 2042 Oral     SpO2 08/04/18 2042 98 %     Weight 08/04/18 2043 122 lb (55.3 kg)     Height 08/04/18 2043 5\' 3"  (1.6 m)     Head Circumference --      Peak Flow --      Pain Score 08/04/18 2043 8     Pain Loc --      Pain Edu? --      Excl. in GC? --     Constitutional: Alert and oriented.  Relatively well appearing and in no acute distress. Eyes: Conjunctivae are normal.  Head: Atraumatic. Nose: No congestion/rhinnorhea. Mouth/Throat: Mucous membranes are moist.     Neck: Normal range of motion.  Cardiovascular: Good peripheral circulation. Respiratory: Normal respiratory effort.  Gastrointestinal: No distention.  Musculoskeletal:  Extremities warm and well perfused.  Tenderness in right inguinal area and pain on AROM of our hip.  2+ DP pulse.  No significant tenderness or deformity to right hand. Neurologic:  Normal speech and language. No gross focal neurologic deficits are appreciated.  Skin:  Skin is warm and dry. No rash noted. Psychiatric: Mood and affect are normal. Speech and behavior are normal.  ____________________________________________   LABS (all labs ordered are listed, but only abnormal results are displayed)  Labs Reviewed  BASIC METABOLIC PANEL - Abnormal; Notable for the following components:      Result Value   Glucose, Bld 111 (*)    All other components within normal limits  CBC WITH DIFFERENTIAL/PLATELET - Abnormal; Notable for the following components:   RBC 3.73 (*)    HCT 34.2 (*)    All other components within normal limits  PROTIME-INR   ____________________________________________  EKG  ED ECG REPORT I, Dionne Bucy, the attending physician, personally viewed and interpreted this ECG.  Date: 08/04/2018 EKG Time: 2255 Rate: 86 Rhythm: Paced rhythm QRS Axis: normal Intervals: normal ST/T Wave abnormalities: normal Narrative Interpretation: No significant acute abnormalities  ____________________________________________  RADIOLOGY  CXR: No acute findings XR R hand: No acute fracture XR R hip: Right subcapital femoral neck fracture  ____________________________________________   PROCEDURES  Procedure(s) performed: No  Procedures  Critical Care performed: No ____________________________________________   INITIAL IMPRESSION / ASSESSMENT AND PLAN / ED COURSE  Pertinent labs & imaging results that were available during my care of the patient were reviewed by me and considered in my  medical decision making (see chart for details).  72 year old female with PMH as noted above presents with right hip and right hand pain after a fall down 2 steps.  She has inability to bear weight on the right leg.  The extremities are neuro/vascular intact.  She had no dizziness or prodrome precipitating the fall.  X-rays reveal right femoral neck fracture.  The patient will require labs for medical clearance, orthopedic consult, and admission.  ----------------------------------------- 12:32 AM on 08/05/2018 -----------------------------------------  I paged Dr. Odis Luster from [facility] although I have not yet heard back.  The patient's lab work-up is unremarkable.  I signed her out to the hospitalist Dr. Caryn Bee for admission. ____________________________________________   FINAL CLINICAL IMPRESSION(S) / ED DIAGNOSES  Final diagnoses:  Closed fracture of neck of right femur, initial encounter (HCC)      NEW MEDICATIONS STARTED DURING THIS VISIT:  New Prescriptions   No medications on file     Note:  This document was prepared using Dragon voice recognition software and may include unintentional dictation errors.    Dionne Bucy, MD 08/05/18 [phone]

## 2018-08-05 ENCOUNTER — Other Ambulatory Visit: Payer: Self-pay

## 2018-08-05 ENCOUNTER — Inpatient Hospital Stay: Payer: Medicare Other

## 2018-08-05 ENCOUNTER — Inpatient Hospital Stay: Payer: Medicare Other | Admitting: Anesthesiology

## 2018-08-05 ENCOUNTER — Inpatient Hospital Stay
Admit: 2018-08-05 | Discharge: 2018-08-05 | Disposition: A | Discharge status: Home / Self Care | Payer: Medicare Other | Attending: Internal Medicine | Admitting: Internal Medicine

## 2018-08-05 ENCOUNTER — Encounter: Payer: Self-pay | Admitting: Internal Medicine

## 2018-08-05 ENCOUNTER — Encounter
Admission: EM | Disposition: A | Discharge status: Skilled Nursing Facility | Payer: Self-pay | Source: Home / Self Care | Attending: Internal Medicine

## 2018-08-05 DIAGNOSIS — I5042 Chronic combined systolic (congestive) and diastolic (congestive) heart failure: Secondary | ICD-10-CM | POA: Diagnosis present

## 2018-08-05 DIAGNOSIS — Z87891 Personal history of nicotine dependence: Secondary | ICD-10-CM | POA: Diagnosis not present

## 2018-08-05 DIAGNOSIS — R739 Hyperglycemia, unspecified: Secondary | ICD-10-CM | POA: Diagnosis present

## 2018-08-05 DIAGNOSIS — F419 Anxiety disorder, unspecified: Secondary | ICD-10-CM | POA: Diagnosis present

## 2018-08-05 DIAGNOSIS — I420 Dilated cardiomyopathy: Secondary | ICD-10-CM | POA: Diagnosis present

## 2018-08-05 DIAGNOSIS — I255 Ischemic cardiomyopathy: Secondary | ICD-10-CM | POA: Diagnosis present

## 2018-08-05 DIAGNOSIS — Y92008 Other place in unspecified non-institutional (private) residence as the place of occurrence of the external cause: Secondary | ICD-10-CM | POA: Diagnosis not present

## 2018-08-05 DIAGNOSIS — E782 Mixed hyperlipidemia: Secondary | ICD-10-CM | POA: Diagnosis present

## 2018-08-05 DIAGNOSIS — I110 Hypertensive heart disease with heart failure: Secondary | ICD-10-CM | POA: Diagnosis present

## 2018-08-05 DIAGNOSIS — K219 Gastro-esophageal reflux disease without esophagitis: Secondary | ICD-10-CM | POA: Diagnosis present

## 2018-08-05 DIAGNOSIS — Z79899 Other long term (current) drug therapy: Secondary | ICD-10-CM | POA: Diagnosis not present

## 2018-08-05 DIAGNOSIS — Z23 Encounter for immunization: Secondary | ICD-10-CM | POA: Diagnosis not present

## 2018-08-05 DIAGNOSIS — Z8673 Personal history of transient ischemic attack (TIA), and cerebral infarction without residual deficits: Secondary | ICD-10-CM | POA: Diagnosis not present

## 2018-08-05 DIAGNOSIS — J449 Chronic obstructive pulmonary disease, unspecified: Secondary | ICD-10-CM | POA: Diagnosis present

## 2018-08-05 DIAGNOSIS — Z95810 Presence of automatic (implantable) cardiac defibrillator: Secondary | ICD-10-CM | POA: Diagnosis not present

## 2018-08-05 DIAGNOSIS — I959 Hypotension, unspecified: Secondary | ICD-10-CM | POA: Diagnosis not present

## 2018-08-05 DIAGNOSIS — S72011A Unspecified intracapsular fracture of right femur, initial encounter for closed fracture: Secondary | ICD-10-CM | POA: Diagnosis present

## 2018-08-05 DIAGNOSIS — I252 Old myocardial infarction: Secondary | ICD-10-CM | POA: Diagnosis not present

## 2018-08-05 DIAGNOSIS — S72001A Fracture of unspecified part of neck of right femur, initial encounter for closed fracture: Secondary | ICD-10-CM | POA: Diagnosis present

## 2018-08-05 DIAGNOSIS — W108XXA Fall (on) (from) other stairs and steps, initial encounter: Secondary | ICD-10-CM | POA: Diagnosis present

## 2018-08-05 HISTORY — PX: HIP ARTHROPLASTY: SHX981

## 2018-08-05 LAB — PHOSPHORUS: Phosphorus: 3.2 mg/dL (ref 2.5–4.6)

## 2018-08-05 LAB — HEPATIC FUNCTION PANEL
ALK PHOS: 91 U/L (ref 38–126)
ALT: 20 U/L (ref 0–44)
AST: 22 U/L (ref 15–41)
Albumin: 4 g/dL (ref 3.5–5.0)
BILIRUBIN TOTAL: 0.6 mg/dL (ref 0.3–1.2)
Bilirubin, Direct: <0.1 mg/dL (ref 0.0–0.2)
Total Protein: 6.8 g/dL (ref 6.5–8.1)

## 2018-08-05 LAB — MRSA PCR SCREENING: MRSA by PCR: NEGATIVE

## 2018-08-05 LAB — CBC
HEMATOCRIT: 32.5 % — AB (ref 35.0–47.0)
HEMOGLOBIN: 11.4 g/dL — AB (ref 12.0–16.0)
MCH: 32.2 pg (ref 26.0–34.0)
MCHC: 35 g/dL (ref 32.0–36.0)
MCV: 91.9 fL (ref 80.0–100.0)
Platelets: 250 10*3/uL (ref 150–440)
RBC: 3.54 MIL/uL — AB (ref 3.80–5.20)
RDW: 13.3 % (ref 11.5–14.5)
WBC: 10.6 10*3/uL (ref 3.6–11.0)

## 2018-08-05 LAB — TROPONIN I

## 2018-08-05 LAB — ECHOCARDIOGRAM COMPLETE
Height: 63 in
Weight: 1952 oz

## 2018-08-05 LAB — CREATININE, SERUM
Creatinine, Ser: 0.8 mg/dL (ref 0.44–1.00)
GFR calc Af Amer: >60 mL/min (ref >60)

## 2018-08-05 LAB — APTT: aPTT: 32 seconds (ref 24–36)

## 2018-08-05 LAB — MAGNESIUM: Magnesium: 2.2 mg/dL (ref 1.7–2.4)

## 2018-08-05 SURGERY — HEMIARTHROPLASTY, HIP, DIRECT ANTERIOR APPROACH, FOR FRACTURE
Anesthesia: Spinal | Laterality: Right

## 2018-08-05 MED ORDER — SODIUM CHLORIDE 0.9 % IV SOLN
INTRAVENOUS | Status: DC | PRN
  Administered 2018-08-05: 300 mL
  Administered 2018-08-05: 200 mL

## 2018-08-05 MED ORDER — BUPIVACAINE HCL (PF) 0.5 % IJ SOLN
INTRAMUSCULAR | Status: DC | PRN
  Administered 2018-08-05: 3 mL

## 2018-08-05 MED ORDER — OXYCODONE HCL 5 MG PO TABS: 5.0000 mg | ORAL_TABLET | Freq: Once | ORAL | Status: DC | PRN

## 2018-08-05 MED ORDER — INFLUENZA VAC SPLIT HIGH-DOSE 0.5 ML IM SUSY
0.5000 mL | PREFILLED_SYRINGE | INTRAMUSCULAR | Status: AC
  Administered 2018-08-08: 0.5 mL via INTRAMUSCULAR
  Filled 2018-08-05 (×2): qty 0.5

## 2018-08-05 MED ORDER — GLYCOPYRROLATE 0.2 MG/ML IJ SOLN
INTRAMUSCULAR | Status: DC | PRN
  Administered 2018-08-05: 0.2 mg via INTRAVENOUS

## 2018-08-05 MED ORDER — SODIUM CHLORIDE 0.9 % IV SOLN
INTRAVENOUS | Status: DC | PRN
  Administered 2018-08-05: 15 ug/min via INTRAVENOUS

## 2018-08-05 MED ORDER — CEFAZOLIN SODIUM-DEXTROSE 2-4 GM/100ML-% IV SOLN
INTRAVENOUS | Status: AC
  Filled 2018-08-05: qty 100

## 2018-08-05 MED ORDER — PHENYLEPHRINE HCL 10 MG/ML IJ SOLN
INTRAMUSCULAR | Status: DC | PRN
  Administered 2018-08-05: 100 ug via INTRAVENOUS
  Administered 2018-08-05: 250 ug via INTRAVENOUS
  Administered 2018-08-05: 100 ug via INTRAVENOUS

## 2018-08-05 MED ORDER — FENTANYL CITRATE (PF) 100 MCG/2ML IJ SOLN: 25.0000 ug | INTRAMUSCULAR | Status: DC | PRN

## 2018-08-05 MED ORDER — PHENOL 1.4 % MT LIQD
1.0000 | OROMUCOSAL | Status: DC | PRN
  Filled 2018-08-05: qty 177

## 2018-08-05 MED ORDER — ENOXAPARIN SODIUM 40 MG/0.4ML ~~LOC~~ SOLN
40.0000 mg | SUBCUTANEOUS | Status: DC
  Administered 2018-08-05: 40 mg via SUBCUTANEOUS
  Filled 2018-08-05: qty 0.4

## 2018-08-05 MED ORDER — KETOROLAC TROMETHAMINE 15 MG/ML IJ SOLN: 7.5000 mg | Freq: Four times a day (QID) | INTRAMUSCULAR | Status: DC

## 2018-08-05 MED ORDER — ENOXAPARIN SODIUM 40 MG/0.4ML ~~LOC~~ SOLN
40.0000 mg | SUBCUTANEOUS | Status: DC
  Administered 2018-08-06 – 2018-08-08 (×3): 40 mg via SUBCUTANEOUS
  Filled 2018-08-05 (×3): qty 0.4

## 2018-08-05 MED ORDER — MORPHINE SULFATE (PF) 4 MG/ML IV SOLN: 0.5000 mg | INTRAVENOUS | Status: DC | PRN

## 2018-08-05 MED ORDER — CEFAZOLIN SODIUM-DEXTROSE 1-4 GM/50ML-% IV SOLN
1.0000 g | Freq: Four times a day (QID) | INTRAVENOUS | Status: AC
  Administered 2018-08-05 – 2018-08-06 (×2): 1 g via INTRAVENOUS
  Filled 2018-08-05 (×2): qty 50

## 2018-08-05 MED ORDER — METOCLOPRAMIDE HCL 5 MG/ML IJ SOLN: 5.0000 mg | Freq: Three times a day (TID) | INTRAMUSCULAR | Status: DC | PRN

## 2018-08-05 MED ORDER — MENTHOL 3 MG MT LOZG
1.0000 | LOZENGE | OROMUCOSAL | Status: DC | PRN
  Filled 2018-08-05: qty 9

## 2018-08-05 MED ORDER — MAGNESIUM CITRATE PO SOLN
1.0000 | Freq: Once | ORAL | Status: DC | PRN
  Filled 2018-08-05: qty 296

## 2018-08-05 MED ORDER — OXYCODONE HCL 5 MG/5ML PO SOLN: 5.0000 mg | Freq: Once | ORAL | Status: DC | PRN

## 2018-08-05 MED ORDER — KETAMINE HCL 50 MG/ML IJ SOLN
INTRAMUSCULAR | Status: AC
  Filled 2018-08-05: qty 10

## 2018-08-05 MED ORDER — METOCLOPRAMIDE HCL 10 MG PO TABS: 5.0000 mg | ORAL_TABLET | Freq: Three times a day (TID) | ORAL | Status: DC | PRN

## 2018-08-05 MED ORDER — CEFAZOLIN SODIUM-DEXTROSE 2-4 GM/100ML-% IV SOLN
2.0000 g | INTRAVENOUS | Status: AC
  Administered 2018-08-05: 2 g via INTRAVENOUS
  Filled 2018-08-05: qty 100

## 2018-08-05 MED ORDER — ONDANSETRON HCL 4 MG/2ML IJ SOLN: 4.0000 mg | Freq: Four times a day (QID) | INTRAMUSCULAR | Status: DC | PRN

## 2018-08-05 MED ORDER — TRAMADOL HCL 50 MG PO TABS
50.0000 mg | ORAL_TABLET | Freq: Four times a day (QID) | ORAL | Status: DC
  Administered 2018-08-08: 50 mg via ORAL
  Filled 2018-08-05 (×2): qty 1

## 2018-08-05 MED ORDER — KETAMINE HCL 50 MG/ML IJ SOLN
INTRAMUSCULAR | Status: DC | PRN
  Administered 2018-08-05: 50 mg via INTRAMUSCULAR

## 2018-08-05 MED ORDER — SENNOSIDES-DOCUSATE SODIUM 8.6-50 MG PO TABS: 1.0000 | ORAL_TABLET | Freq: Every evening | ORAL | Status: DC | PRN

## 2018-08-05 MED ORDER — HYDROCODONE-ACETAMINOPHEN 5-325 MG PO TABS
1.0000 | ORAL_TABLET | ORAL | Status: DC | PRN
  Administered 2018-08-05: 1 via ORAL
  Filled 2018-08-05: qty 1

## 2018-08-05 MED ORDER — GLYCOPYRROLATE 0.2 MG/ML IJ SOLN
INTRAMUSCULAR | Status: AC
  Filled 2018-08-05: qty 1

## 2018-08-05 MED ORDER — BUPIVACAINE HCL (PF) 0.5 % IJ SOLN
INTRAMUSCULAR | Status: AC
  Filled 2018-08-05: qty 10

## 2018-08-05 MED ORDER — ACETAMINOPHEN 325 MG PO TABS
325.0000 mg | ORAL_TABLET | Freq: Four times a day (QID) | ORAL | Status: DC | PRN
  Administered 2018-08-06 (×2): 325 mg via ORAL
  Administered 2018-08-07 – 2018-08-08 (×3): 650 mg via ORAL
  Filled 2018-08-05 (×4): qty 2

## 2018-08-05 MED ORDER — SODIUM CHLORIDE 0.9 % IV BOLUS
500.0000 mL | Freq: Once | INTRAVENOUS | Status: AC
  Administered 2018-08-05: 500 mL via INTRAVENOUS

## 2018-08-05 MED ORDER — DOCUSATE SODIUM 100 MG PO CAPS
100.0000 mg | ORAL_CAPSULE | Freq: Two times a day (BID) | ORAL | Status: DC
  Administered 2018-08-05 – 2018-08-08 (×5): 100 mg via ORAL
  Filled 2018-08-05 (×6): qty 1

## 2018-08-05 MED ORDER — BISACODYL 5 MG PO TBEC: 5.0000 mg | DELAYED_RELEASE_TABLET | Freq: Every day | ORAL | Status: DC | PRN

## 2018-08-05 MED ORDER — HYDROMORPHONE HCL 1 MG/ML IJ SOLN: 0.5000 mg | INTRAMUSCULAR | Status: DC | PRN

## 2018-08-05 MED ORDER — PANTOPRAZOLE SODIUM 40 MG PO TBEC
40.0000 mg | DELAYED_RELEASE_TABLET | Freq: Every day | ORAL | Status: DC
  Administered 2018-08-05 – 2018-08-08 (×4): 40 mg via ORAL
  Filled 2018-08-05 (×4): qty 1

## 2018-08-05 MED ORDER — BISACODYL 10 MG RE SUPP
10.0000 mg | Freq: Every day | RECTAL | Status: DC | PRN
  Administered 2018-08-07: 10 mg via RECTAL
  Filled 2018-08-05: qty 1

## 2018-08-05 MED ORDER — LACTATED RINGERS IV BOLUS
350.0000 mL | Freq: Once | INTRAVENOUS | Status: AC
  Administered 2018-08-05: 350 mL via INTRAVENOUS

## 2018-08-05 MED ORDER — LACTATED RINGERS IV SOLN
INTRAVENOUS | Status: DC
  Administered 2018-08-05 – 2018-08-06 (×2): via INTRAVENOUS

## 2018-08-05 MED ORDER — PROPOFOL 10 MG/ML IV BOLUS
INTRAVENOUS | Status: DC | PRN
  Administered 2018-08-05: 40 mg via INTRAVENOUS

## 2018-08-05 MED ORDER — HYDROCODONE-ACETAMINOPHEN 7.5-325 MG PO TABS: 1.0000 | ORAL_TABLET | ORAL | Status: DC | PRN

## 2018-08-05 MED ORDER — SPIRONOLACTONE 25 MG PO TABS
25.0000 mg | ORAL_TABLET | Freq: Every day | ORAL | Status: DC
  Administered 2018-08-05: 25 mg via ORAL
  Filled 2018-08-05: qty 1

## 2018-08-05 MED ORDER — ONDANSETRON HCL 4 MG/2ML IJ SOLN
4.0000 mg | Freq: Four times a day (QID) | INTRAMUSCULAR | Status: DC | PRN
  Administered 2018-08-05: 4 mg via INTRAVENOUS
  Filled 2018-08-05: qty 2

## 2018-08-05 MED ORDER — ONDANSETRON HCL 4 MG PO TABS: 4.0000 mg | ORAL_TABLET | Freq: Four times a day (QID) | ORAL | Status: DC | PRN

## 2018-08-05 MED ORDER — PROPOFOL 500 MG/50ML IV EMUL
INTRAVENOUS | Status: DC | PRN
  Administered 2018-08-05: 50 ug/kg/min via INTRAVENOUS

## 2018-08-05 MED ORDER — PROPOFOL 500 MG/50ML IV EMUL
INTRAVENOUS | Status: AC
  Filled 2018-08-05: qty 50

## 2018-08-05 MED ORDER — ACETAMINOPHEN 325 MG PO TABS
650.0000 mg | ORAL_TABLET | Freq: Four times a day (QID) | ORAL | Status: DC | PRN
  Administered 2018-08-05 (×2): 650 mg via ORAL
  Filled 2018-08-05 (×2): qty 2

## 2018-08-05 MED ORDER — BUPIVACAINE-EPINEPHRINE (PF) 0.25% -1:200000 IJ SOLN
INTRAMUSCULAR | Status: DC | PRN
  Administered 2018-08-05: 20 mL via PERINEURAL

## 2018-08-05 MED ORDER — ALPRAZOLAM 0.25 MG PO TABS
0.2500 mg | ORAL_TABLET | Freq: Two times a day (BID) | ORAL | Status: DC
  Administered 2018-08-05 – 2018-08-08 (×5): 0.25 mg via ORAL
  Filled 2018-08-05 (×6): qty 1

## 2018-08-05 MED ORDER — METOPROLOL SUCCINATE ER 25 MG PO TB24
25.0000 mg | ORAL_TABLET | Freq: Every day | ORAL | Status: DC
  Administered 2018-08-05: 25 mg via ORAL
  Filled 2018-08-05: qty 1

## 2018-08-05 MED ORDER — LACTATED RINGERS IV SOLN
INTRAVENOUS | Status: DC
  Administered 2018-08-05: 14:00:00 via INTRAVENOUS

## 2018-08-05 MED ORDER — LOSARTAN POTASSIUM 25 MG PO TABS
25.0000 mg | ORAL_TABLET | Freq: Every day | ORAL | Status: DC
  Filled 2018-08-05: qty 1

## 2018-08-05 SURGICAL SUPPLY — 44 items
BLADE SAGITTAL WIDE XTHICK NO (BLADE) ×3 IMPLANT
BRUSH SCRUB EZ  4% CHG (MISCELLANEOUS) ×4
BRUSH SCRUB EZ 4% CHG (MISCELLANEOUS) ×2 IMPLANT
CHLORAPREP W/TINT 26ML (MISCELLANEOUS) ×6 IMPLANT
DRAPE C-ARM 42X72 X-RAY (DRAPES) ×3 IMPLANT
DRAPE SHEET LG 3/4 BI-LAMINATE (DRAPES) ×6 IMPLANT
DRAPE STERI IOBAN 125X83 (DRAPES) ×3 IMPLANT
DRSG AQUACEL AG ADV 3.5X10 (GAUZE/BANDAGES/DRESSINGS) ×3 IMPLANT
DRSG AQUACEL AG ADV 3.5X14 (GAUZE/BANDAGES/DRESSINGS) ×3 IMPLANT
ELECT BLADE 6.5 EXT (BLADE) ×3 IMPLANT
ELECT REM PT RETURN 9FT ADLT (ELECTROSURGICAL) ×3
ELECTRODE REM PT RTRN 9FT ADLT (ELECTROSURGICAL) ×1 IMPLANT
GAUZE PETRO XEROFOAM 1X8 (MISCELLANEOUS) ×3 IMPLANT
GLOVE INDICATOR 8.0 STRL GRN (GLOVE) ×3 IMPLANT
GLOVE SURG ORTHO 8.0 STRL STRW (GLOVE) ×3 IMPLANT
GOWN STRL REUS W/ TWL LRG LVL3 (GOWN DISPOSABLE) ×2 IMPLANT
GOWN STRL REUS W/ TWL XL LVL3 (GOWN DISPOSABLE) ×1 IMPLANT
GOWN STRL REUS W/TWL LRG LVL3 (GOWN DISPOSABLE) ×4
GOWN STRL REUS W/TWL XL LVL3 (GOWN DISPOSABLE) ×2
HEAD 28MM 0 (Hips) ×3 IMPLANT
HEAD BIPOLAR 46MM (Hips) ×3 IMPLANT
HOOD PEEL AWAY FLYTE STAYCOOL (MISCELLANEOUS) ×9 IMPLANT
IV NS 1000ML (IV SOLUTION) ×2
IV NS 1000ML BAXH (IV SOLUTION) ×1 IMPLANT
KIT PATIENT CARE HANA TABLE (KITS) ×3 IMPLANT
KIT TURNOVER CYSTO (KITS) ×3 IMPLANT
MAT ABSORB  FLUID 56X50 GRAY (MISCELLANEOUS) ×2
MAT ABSORB FLUID 56X50 GRAY (MISCELLANEOUS) ×1 IMPLANT
NDL SAFETY ECLIPSE 18X1.5 (NEEDLE) ×2 IMPLANT
NEEDLE HYPO 18GX1.5 SHARP (NEEDLE) ×4
NEEDLE HYPO 22GX1.5 SAFETY (NEEDLE) ×3 IMPLANT
NEEDLE SPNL 20GX3.5 QUINCKE YW (NEEDLE) ×3 IMPLANT
PACK HIP PROSTHESIS (MISCELLANEOUS) ×3 IMPLANT
PADDING CAST BLEND 4X4 NS (MISCELLANEOUS) ×6 IMPLANT
PILLOW ABDUCTION MEDIUM (MISCELLANEOUS) ×3 IMPLANT
PULSAVAC PLUS IRRIG FAN TIP (DISPOSABLE) ×3
STAPLER SKIN PROX 35W (STAPLE) ×3 IMPLANT
STEM STD COLLAR SZ2 POLARSTEM (Stem) ×3 IMPLANT
SUT BONE WAX W31G (SUTURE) ×3 IMPLANT
SUT DVC 2 QUILL PDO  T11 36X36 (SUTURE) ×2
SUT DVC 2 QUILL PDO T11 36X36 (SUTURE) ×1 IMPLANT
SUT VIC AB 2-0 CT1 18 (SUTURE) ×3 IMPLANT
SYR 20CC LL (SYRINGE) ×3 IMPLANT
TIP FAN IRRIG PULSAVAC PLUS (DISPOSABLE) ×1 IMPLANT

## 2018-08-05 NOTE — [place] FL2 (Signed)
[place] MEDICAID FL2 LEVEL OF CARE SCREENING TOOL     IDENTIFICATION  Patient Name: Erica Melendez Birthdate: 24-Aug-1946 Sex: female Admission Date (Current Location): 08/04/2018  [place] Number:  Chiropodist and Address:  [facility], [address], [place], [place] [address]      Provider Number: [id number]  Attending Physician Name and Address:  Houston Siren, MD  Relative Name and Phone Number:       Current Level of Care: Hospital Recommended Level of Care: Skilled Nursing Facility Prior Approval Number:    Date Approved/Denied:   PASRR Number: [id number])  Discharge Plan: SNF    Current Diagnoses: Patient Active Problem List   Diagnosis Date Noted  . Closed displaced fracture of right femoral neck (HCC) 08/05/2018    Orientation RESPIRATION BLADDER Height & Weight     Self, Time, Situation, Place  Normal Continent Weight: 122 lb (55.3 kg) Height:  5\' 3"  (160 cm)  BEHAVIORAL SYMPTOMS/MOOD NEUROLOGICAL BOWEL NUTRITION STATUS      Continent Diet(Diet: NPO for surgery to be advanced. )  AMBULATORY STATUS COMMUNICATION OF NEEDS Skin   Extensive Assist Verbally Surgical wounds                       Personal Care Assistance Level of Assistance  Bathing, Feeding, Dressing Bathing Assistance: Limited assistance Feeding assistance: Independent Dressing Assistance: Limited assistance     Functional Limitations Info  Sight, Hearing, Speech Sight Info: Adequate Hearing Info: Adequate Speech Info: Adequate    SPECIAL CARE FACTORS FREQUENCY  PT (By licensed PT), OT (By licensed OT)     PT Frequency: (5) OT Frequency: (5)            Contractures      Additional Factors Info  Code Status, Allergies Code Status Info: (Full Code. ) Allergies Info: (Cleocin Clindamycin Hcl, Erythromycin, Penicillins, Serzone Nefazodone)           Current Medications (08/05/2018):  This is the current  hospital active medication list Current Facility-Administered Medications  Medication Dose Route Frequency Provider Last Rate Last Dose  . acetaminophen (TYLENOL) tablet 650 mg  650 mg Oral Q6H PRN Barbaraann Rondo, MD   650 mg at 08/05/18 0356  . ALPRAZolam Prudy Feeler) tablet 0.25 mg  0.25 mg Oral BID Barbaraann Rondo, MD   0.25 mg at 08/05/18 0356  . bisacodyl (DULCOLAX) EC tablet 5 mg  5 mg Oral Daily PRN Barbaraann Rondo, MD      . enoxaparin (LOVENOX) injection 40 mg  40 mg Subcutaneous Q24H Marjie Skiff, Prasanna, MD      . HYDROmorphone (DILAUDID) injection 0.5-1 mg  0.5-1 mg Intravenous Q3H PRN Barbaraann Rondo, MD      . Melene Muller ON 08/06/2018] Influenza vac split quadrivalent PF (FLUZONE HIGH-DOSE) injection 0.5 mL  0.5 mL Intramuscular Tomorrow-1000 Barbaraann Rondo, MD      . losartan (COZAAR) tablet 25 mg  25 mg Oral Daily Barbaraann Rondo, MD      . metoprolol succinate (TOPROL-XL) 24 hr tablet 25 mg  25 mg Oral Daily Barbaraann Rondo, MD      . ondansetron (ZOFRAN) injection 4 mg  4 mg Intravenous Q6H PRN Barbaraann Rondo, MD      . pantoprazole (PROTONIX) EC tablet 40 mg  40 mg Oral Daily Sridharan, Prasanna, MD      . senna-docusate (Senokot-S) tablet 1 tablet  1 tablet Oral QHS PRN Barbaraann Rondo, MD      .  spironolactone (ALDACTONE) tablet 25 mg  25 mg Oral Daily Barbaraann Rondo, MD         Discharge Medications: Please see discharge summary for a list of discharge medications.  Relevant Imaging Results:  Relevant Lab Results:   Additional Information (SSN: [id number])  Rickardo Brinegar, Darleen Crocker, LCSW

## 2018-08-05 NOTE — Clinical Social Work Note (Addendum)
Clinical Social Work Assessment  Patient Details  Name: Erica Melendez MRN: [id number] Date of Birth: 1946-11-05  Date of referral:  08/05/18               Reason for consult:  Facility Placement                Permission sought to share information with:  Chartered certified accountant granted to share information::  Yes, Verbal Permission Granted  Name::      [place]::   [place], [place]  Relationship::     Contact Information:     Housing/Transportation Living arrangements for the past 2 months:  Single Family Home Source of Information:  Patient, Adult Children, Spouse Patient Interpreter Needed:  None Criminal Activity/Legal Involvement Pertinent to Current Situation/Hospitalization:  No - Comment as needed Significant Relationships:  Spouse, Adult Children Lives with:  Spouse Do you feel safe going back to the place where you live?  Yes Need for family participation in patient care:  Yes (Comment)  Care giving concerns:  Patient is in the middle of moving from [place], [place] to [place], [place].    Social Worker assessment / plan:  Holiday representative ([place]) reviewed chart and noted that patient has a hip fracture. Surgery and PT are pending. CSW met with patient today prior to surgery and her daughter [name] and husband [name] were at bedside. Patient was alert and oriented X4 and was laying in the bed. CSW introduced self and explained role of CSW department. Per patient she and her husband have a house in [place] and are in the process of moving. Patient's adult children live in [place]. CSW explained that after surgery PT will evaluate patient and make a recommendation of outpatient PT, home health PT or SNF. CSW explained that medicare requires a 3 night qualifying inpatient stay in a hospital in order to pay for SNF. Patient was admitted to inpatient 08/05/18. Per daughter they prefer for patient to go to outpatient therapy at [facility] office in [place] or have home health PT through [facility] in [place]. Daughter stated that she prefers [facility] for SNF in [place] if needed. CSW explained that there is not a [facility] SNF in [place] however there is an [facility] in [place]. Per daughter she is going to talk to her sister and get back to CSW about the SNF preference. Medicare bundle case manager is aware of above. FL2 complete. CSW will continue to follow and assist as needed.   CSW sent SNF referral to [facility] in [place] at [employer] request.   Per [employer] at [facility] they have been full for 3 weeks. Per Nira Conn she will review referral and call CSW Monday. CSW also sent referral to [facility] at [facility] request. Per [employer] at [facility] she will call CSW on Monday. Daughter also requested for CSW to send referral to [facility] however there is not a facility in [place] by that name.   Employment status:  Retired Forensic scientist:  Medicare PT Recommendations:  Not assessed at this time [place] / Referral to community resources:  [facility](SNF vs. [facility] )  Patient/Family's Response to care:  Patient prefers to D/C home with outpatient PT or home health.   Patient/Family's Understanding of and Emotional Response to Diagnosis, Current Treatment, and Prognosis:  Patient and her family were very pleasant and thanked CSW for assistance.   Emotional Assessment Appearance:  Appears stated age Attitude/Demeanor/Rapport:    Affect (typically observed):  Accepting, Adaptable, Pleasant Orientation:  Oriented to Self, Oriented to Place, Oriented to  Time, Oriented to Situation Alcohol / Substance use:  Not Applicable Psych involvement (Current and /or in the community):  No (Comment)  Discharge Needs  Concerns to be addressed:  Discharge Planning Concerns Readmission within the last 30 days:   No Current discharge risk:  Dependent with Mobility Barriers to Discharge:  Continued Medical Work up   [employer], Veronia Beets, LCSW 08/05/2018, 12:36 PM

## 2018-08-05 NOTE — Op Note (Signed)
08/05/2018  4:25 PM  PATIENT:  Erica Melendez   MRN: [id number]  PRE-OPERATIVE DIAGNOSIS:  Displaced Subcapital fracture right hip   POST-OPERATIVE DIAGNOSIS: Same  Procedure: Right Hip Anterior Hip Hemiarthroplasty   Surgeon: Dola Argyle. Odis Luster, MD   Anesthesia: Spinal   EBL: 100 mL   Specimens: None   Drains: None   Components used: A size 2 Polarstem Smith [employer]hew, a 46 mm bipolar head    Description of the procedure in detail: After informed consent was obtained and the appropriate extremity marked in the pre-operative holding area, the patient was taken to the operating room and placed in the supine position on the fracture table. All pressure points were well padded and bilateral lower extremities were place in traction spars. The hip was prepped and draped in standard sterile fashion. A spinal anesthetic had been delivered by the anesthesia team. The skin and subcutaneous tissues were injected with a mixture of Marcaine with epinephrine for post-operative pain. A longitudinal incision approximately 10 cm in length was carried out from the anterior superior iliac spine to the greater trochanter. The tensor fascia was divided and blunt dissection was taken down to the level of the joint capsule. The lateral circumflex vessels were cauterized. Deep retractors were placed and a portion of the anterior capsule was excised. Using fluoroscopy the neck cut was planned and carried out with a sagittal saw. The head was passed from the field with use of a corkscrew and hip skid. Deep retractors were placed along the acetabulum and bony and soft tissue debris was removed.   Attention was then turned to the proximal femur. The leg was placed in extension and external rotation. The canal was opened and sequentially broached to a size 2. The trial components were placed and the hip relocated. The components were found to be in good position using fluoroscopy. The hip was dislocated and the trial  components removed. The final components were impacted in to position and the hip relocated. The final components were again check with fluoroscopy and found to be in good position. Hemostasis was achieved with electrocautery. The deep capsule was injected with Marcaine and epinephrine. The wound was irrigated with bacitracin laced normal saline and the tensor fascia closed with #2 Quill suture. The subcutaneous tissues were closed with 2-0 vicryl and staples for the skin. A sterile dressing was applied and an abduction pillow. Patient tolerated the procedure well and there were no apparent complication. Patient was taken to the recovery room in good condition.    Dola Argyle. Odis Luster, MD  08/05/2018 4:25 PM

## 2018-08-05 NOTE — H&P (Signed)
[facility] [employer] [facility] [place] [facility]   PATIENT NAME: Erica Melendez    MR#:  [id number]  DATE OF BIRTH:  1945-12-20  DATE OF ADMISSION:  08/04/2018  PRIMARY CARE PHYSICIAN: Lauro Regulus, MD   REQUESTING/REFERRING PHYSICIAN: Dionne Bucy, MD  CHIEF COMPLAINT:   Chief Complaint  Patient presents with  . Fall    HISTORY OF PRESENT ILLNESS:  Erica Melendez  is a 72 y.o. female with a known history of chronic systolic + diastolic CHF/ICM (EF 40% + grade 1 diastolic dysfxn + moderate TR & MR as of 03/09/2017 Cardiology office visit note), LBBB, [employer] AICD/PPM, COPD, who now p/w mechanical fall + R hip fracture. Pt states that she walks w/ cane, but she does admit to some balance issues. She does not admit to frequent falls, but her husband (at bedside) tells me she has fallen 3-4x this year, though this is the first time she has sustained any sort of significant injury in the process. She states she was walking inside the house between rooms, lost balance and fell forward and to the right. She states her R forearm/hand and R hip hit the ground at about the same time. She immediately developed pain in the hip, and she tells me she knew it was broken. She denies CP, SOB, palpitations, diaphoresis, LH/LOC or head injury. She states she feels fine if she is lying still, and only has pain when she moves about. She is otherwise w/o complaint.  Pt used to see Dr. Gwen Pounds of Cardiology, last seen 03/09/2017. She now follows w/ Dr. Aileen Pilot at [facility]. She states her device is due for a generator change in the next 2-52mo.  PAST MEDICAL HISTORY:   Past Medical History:  Diagnosis Date  . Anemia   . Arthritis   . Cardiomyopathy (HCC)   . Clostridium difficile colitis 1996  . COPD (chronic obstructive pulmonary disease) (HCC)   . Depression   . Diverticulosis   . Erosive gastritis 01/14/2015  . GERD (gastroesophageal reflux disease)   . Hemorrhoids     . History of chickenpox   . History of hiatal hernia   . Hyperlipidemia   . LBBB (left bundle branch block)   . Mitral insufficiency   . Mitral valve prolapse   . Myocardial infarction (HCC)   . Pleurisy   . Stroke [facility])     PAST SURGICAL HISTORY:   Past Surgical History:  Procedure Laterality Date  . BREAST SURGERY    . CHOLECYSTECTOMY    . COLONOSCOPY    . DILATION AND CURETTAGE OF UTERUS    . ESOPHAGOGASTRODUODENOSCOPY (EGD) WITH PROPOFOL N/A 12/09/2015   Procedure: ESOPHAGOGASTRODUODENOSCOPY (EGD) WITH PROPOFOL;  Surgeon: Christena Deem, MD;  Location: [facility];  Service: Endoscopy;  Laterality: N/A;  . SHOULDER ARTHROSCOPY WITH ROTATOR CUFF REPAIR Left 07/27/2011    SOCIAL HISTORY:   Social History   Tobacco Use  . Smoking status: Former Smoker    Packs/day: 1.00    Years: 20.00    Pack years: 20.00    Types: Cigarettes    Last attempt to quit: 01/17/1993    Years since quitting: 25.5  . Smokeless tobacco: Never Used  Substance Use Topics  . Alcohol use: No    FAMILY HISTORY:  History reviewed. No pertinent family history.  DRUG ALLERGIES:   Allergies  Allergen Reactions  . Cleocin [Clindamycin Hcl]   . Erythromycin   . Penicillins   . Serzone [Nefazodone]  REVIEW OF SYSTEMS:   Review of Systems  Constitutional: Negative for chills, diaphoresis, fever, malaise/fatigue and weight loss.  HENT: Negative for congestion, ear pain, hearing loss, nosebleeds, sinus pain, sore throat and tinnitus.   Eyes: Negative for blurred vision, double vision and photophobia.  Respiratory: Negative for cough, hemoptysis, sputum production, shortness of breath and wheezing.   Cardiovascular: Negative for chest pain, palpitations, orthopnea, claudication, leg swelling and PND.  Gastrointestinal: Negative for abdominal pain, blood in stool, constipation, diarrhea, heartburn, melena, nausea and vomiting.  Genitourinary: Negative for dysuria, frequency, hematuria  and urgency.  Musculoskeletal: Positive for falls and joint pain. Negative for back pain, myalgias and neck pain.  Skin: Negative for itching and rash.  Neurological: Negative for dizziness, tingling, tremors, sensory change, speech change, focal weakness, seizures, loss of consciousness, weakness and headaches.  Psychiatric/Behavioral: Negative for memory loss. The patient does not have insomnia.    MEDICATIONS AT HOME:   Prior to Admission medications   Medication Sig Start Date End Date Taking? Authorizing Provider  ALPRAZolam (XANAX) 0.25 MG tablet Take 0.25 mg by mouth 2 (two) times daily.   Yes [provider]  losartan (COZAAR) 25 MG tablet Take 25 mg by mouth daily.   Yes [provider]  metoprolol succinate (TOPROL-XL) 25 MG 24 hr tablet Take 25 mg by mouth daily.   Yes [provider]  pantoprazole (PROTONIX) 40 MG tablet Take 40 mg by mouth daily.   Yes [provider]  spironolactone (ALDACTONE) 25 MG tablet Take 25 mg by mouth daily.   Yes [provider]      VITAL SIGNS:  Blood pressure 106/69, pulse 96, temperature 98.2 F (36.8 C), temperature source Oral, resp. rate 19, height 5\' 3"  (1.6 m), weight 55.3 kg, SpO2 100 %.  PHYSICAL EXAMINATION:  Physical Exam  Constitutional: She is oriented to person, place, and time. She appears well-developed and well-nourished. She is active and cooperative.  Non-toxic appearance. She does not have a sickly appearance. She does not appear ill. No distress. She is not intubated.  HENT:  Head: Normocephalic and atraumatic.  Mouth/Throat: Oropharynx is clear and moist. No oropharyngeal exudate.  Eyes: Conjunctivae, EOM and lids are normal. No scleral icterus.  Neck: Neck supple. No JVD present. No thyromegaly present.  Cardiovascular: Normal rate, regular rhythm, S1 normal, S2 normal and normal heart sounds.  No extrasystoles are present. Exam reveals no gallop, no S3, no S4, no distant heart  sounds and no friction rub.  No murmur heard. Pulmonary/Chest: Effort normal. No accessory muscle usage or stridor. No apnea, no tachypnea and no bradypnea. She is not intubated. No respiratory distress. She has decreased breath sounds in the right upper field, the right middle field, the right lower field, the left upper field, the left middle field and the left lower field. She has no wheezes. She has no rhonchi. She has no rales.  Abdominal: Soft. Bowel sounds are normal. She exhibits no distension. There is no tenderness. There is no rigidity, no rebound and no guarding.  Musculoskeletal: She exhibits tenderness. She exhibits no edema.  Lymphadenopathy:    She has no cervical adenopathy.  Neurological: She is alert and oriented to person, place, and time. She is not disoriented.  Skin: Skin is warm, dry and intact. No rash noted. She is not diaphoretic. No erythema.  Psychiatric: She has a normal mood and affect. Her speech is normal and behavior is normal. Judgment and thought content normal. Cognition and memory  are normal.   R hip TTP, decreased AROM/PROM (+) pain. LABORATORY PANEL:   CBC Recent Labs  Lab 08/04/18 2249  WBC 9.1  HGB 12.1  HCT 34.2*  PLT 276   ------------------------------------------------------------------------------------------------------------------  Chemistries  Recent Labs  Lab 08/04/18 2249  NA 137  K 3.7  CL 103  CO2 28  GLUCOSE 111*  BUN 13  CREATININE 0.80  CALCIUM 9.0  MG 2.2  AST 22  ALT 20  ALKPHOS 91  BILITOT 0.6   ------------------------------------------------------------------------------------------------------------------  Cardiac Enzymes Recent Labs  Lab 08/04/18 2249  TROPONINI <0.03   ------------------------------------------------------------------------------------------------------------------  RADIOLOGY:  Dg Chest 1 View  Result Date: 08/04/2018 CLINICAL DATA:  Fall at home today going down 2 steps. EXAM:  CHEST  1 VIEW COMPARISON:  05/06/2010 and chest CT 08/10/2014 FINDINGS: Left-sided pacemaker is present. Lungs are adequately inflated and otherwise clear. Mild cardiomegaly. Moderate size hiatal hernia. No acute fracture. IMPRESSION: No acute findings. Moderate size hiatal hernia. Electronically Signed   By: Elberta Fortis M.D.   On: 08/04/2018 21:46   Dg Hand Complete Right  Result Date: 08/04/2018 CLINICAL DATA:  Fall going down 2 steps that home today with right hand pain. EXAM: RIGHT HAND - COMPLETE 3+ VIEW COMPARISON:  None. FINDINGS: Examination demonstrates moderate degenerate change at the first carpometacarpal joint with slight lateral subluxation of the first metacarpal on the trapezium. No acute fracture. IMPRESSION: No acute fracture. Electronically Signed   By: Elberta Fortis M.D.   On: 08/04/2018 21:45   Dg Hip Unilat With Pelvis 2-3 Views Right  Result Date: 08/04/2018 CLINICAL DATA:  Fall at home today going down 2 steps with right hip pain. EXAM: DG HIP (WITH OR WITHOUT PELVIS) 2-3V RIGHT COMPARISON:  CT 07/31/2005 FINDINGS: Exam demonstrates minimally displaced right subcapital femoral neck fracture. Mild degenerate change of the spine. Remainder of the exam is unremarkable. IMPRESSION: Minimally displaced right subcapital femoral neck fracture. Electronically Signed   By: Elberta Fortis M.D.   On: 08/04/2018 21:43   IMPRESSION AND PLAN:   A/P: 60F mechanical fall + R minimally-displaced subcapital femoral neck fracture. Prior documented Hx CHF/ICM, LBBB, AICD/PPM. Hyperglycemia. -Fall, R hip Fx: Pain mgmt. NWB RLE. Orthopedic surgery consult. Expect pt will need surgical repair. Moderate cardiac risk for a low-to-moderate risk non-cardiovascular procedure. Cardiology consult, Echocardiogram, cardiac monitoring, EKG. DVT PPx, P/T, perioperative ABx. Fall precautions. -CHF/ICM, LBBB, AICD/PPM: Cardiology consult as above. Echo. Device interrogation. EKG, cardiac monitoring. c/w home  cardiac meds. -c/w other home meds. -FEN/GI: NPO. -DVT PPx: Lovenox. -Code status: Full code. -Disposition: Admission, > 2 midnights.   All the records are reviewed and case discussed with ED provider. Management plans discussed with the patient, family and they are in agreement.  CODE STATUS: Full code.  TOTAL TIME TAKING CARE OF THIS PATIENT: 75 minutes.    Barbaraann Rondo M.D on 08/05/2018 at 2:50 AM  Between 7am to 6pm - Pager - [phone]  After 6pm go to [email or web address] - Scientist, research (life sciences) [facility]  Office  [phone]  CC: Primary care physician; Lauro Regulus, MD   Note: This dictation was prepared with Dragon dictation along with smaller phrase technology. Any transcriptional errors that result from this process are unintentional.

## 2018-08-05 NOTE — Transfer of Care (Signed)
Immediate Anesthesia Transfer of Care Note  Patient: Erica Melendez  Procedure(s) Performed: ARTHROPLASTY BIPOLAR HIP (HEMIARTHROPLASTY) (Right )  Patient Location: PACU  Anesthesia Type:Spinal  Level of Consciousness: awake, alert  and oriented  Airway & Oxygen Therapy: Patient Spontanous Breathing  Post-op Assessment: Report given to RN and Post -op Vital signs reviewed and stable  Post vital signs: Reviewed and stable  Last Vitals:  Vitals Value Taken Time  BP 84/63 08/05/2018  4:26 PM  Temp    Pulse 92 08/05/2018  4:26 PM  Resp 17 08/05/2018  4:26 PM  SpO2 97 % 08/05/2018  4:26 PM    Last Pain:  Vitals:   08/05/18 1336  TempSrc: Temporal  PainSc: 0-No pain      Patients Stated Pain Goal: 2 (08/05/18 0356)  Complications: No apparent anesthesia complications

## 2018-08-05 NOTE — Consult Note (Signed)
Detailed note to follow. Plan hemiarthroplasty later today if cleared by medicine and cardiology. NPO. Please call with questions,

## 2018-08-05 NOTE — Anesthesia Procedure Notes (Signed)
Spinal  Patient location during procedure: OR End time: 08/05/2018 2:42 PM Staffing Anesthesiologist: Durenda Hurt, MD Resident/CRNA: Jonna Clark, CRNA Performed: anesthesiologist  Preanesthetic Checklist Completed: patient identified, site marked, surgical consent, pre-op evaluation, timeout performed, IV checked, risks and benefits discussed and monitors and equipment checked Spinal Block Patient position: sitting Prep: Betadine Patient monitoring: heart rate, continuous pulse ox, blood pressure and cardiac monitor Approach: midline Location: L4-5 Injection technique: single-shot Needle Needle type: Whitacre and Introducer  Needle gauge: 24 G Needle length: 9 cm Additional Notes Negative paresthesia. Negative blood return. Positive free-flowing CSF. Expiration date of kit checked and confirmed. Patient tolerated procedure well, without complications.

## 2018-08-05 NOTE — Progress Notes (Signed)
Initial Nutrition Assessment  DOCUMENTATION CODES:   Not applicable  INTERVENTION:  Monitor for diet advancement. Currently NPO for surgery.  Several attempts to see pt today w/out success.    NUTRITION DIAGNOSIS:   Inadequate oral intake related to inability to eat, acute illness(surgery required for hip fracture) as evidenced by NPO status.   GOAL:   Patient will meet greater than or equal to 90% of their needs   MONITOR:   Diet advancement  REASON FOR ASSESSMENT:   Malnutrition Screening Tool, Consult Hip fracture protocol  ASSESSMENT:   Pt admitted 9/26 w/ rt femoral fracture d/t fall. Husband reports recent history of falls 3-4x this year. PMH: CHF(EF 40%), COPD, Erosive gastritis, GERD, Diverticulosis, Cholecystectomy    Medications Reviewed: Protonix  No pertinent labs    NUTRITION - FOCUSED PHYSICAL EXAM: Unable to complete 9/27 - pt in surgery    Diet Order:   Diet Order            Diet NPO time specified Except for: Ice Chips, Sips with Meds  Diet effective now              EDUCATION NEEDS:   No education needs have been identified at this time  Skin:  Skin Assessment: Reviewed RN Assessment(abrasion to left knee)  Last BM:  9/25(Reviewed 9/27 RN note)  Height:   Ht Readings from Last 1 Encounters:  08/05/18 5\' 3"  (1.6 m)    Weight:   Wt Readings from Last 1 Encounters:  08/05/18 55.3 kg   08/05/18 55.3 kg  12/09/15 72.1 kg     Ideal Body Weight:  52.3 kg  BMI:  Body mass index is 21.61 kg/m.  Estimated Nutritional Needs:   Kcal:  1610-9604 (25-30kcal/kg)  Protein:  72-85 (1.3-1.5g/kg)  Fluid:  1.2L    Lars Masson, RD, LDN  After Hours/Weekend Pager: [phone]

## 2018-08-05 NOTE — Progress Notes (Signed)
[employer] [facility] [place] [facility]   PATIENT NAME: Erica Melendez    MR#:  [id number]  DATE OF BIRTH:  07/15/1946  SUBJECTIVE:   Patient presents the hospital after a mechanical fall and noted to have a hip fracture.  Patient denied any prodromal symptoms prior to her fall including chest pain, palpitations or shortness of breath.    REVIEW OF SYSTEMS:    Review of Systems  Constitutional: Negative for chills and fever.  HENT: Negative for congestion and tinnitus.   Eyes: Negative for blurred vision and double vision.  Respiratory: Negative for cough, shortness of breath and wheezing.   Cardiovascular: Negative for chest pain, orthopnea and PND.  Gastrointestinal: Negative for abdominal pain, diarrhea, nausea and vomiting.  Genitourinary: Negative for dysuria and hematuria.  Musculoskeletal: Positive for falls and joint pain (Right Hip pain).  Neurological: Negative for dizziness, sensory change and focal weakness.  All other systems reviewed and are negative.   Nutrition: NPO for surgery Tolerating Diet: No Tolerating PT: Await Eval post-operatively.   DRUG ALLERGIES:   Allergies  Allergen Reactions  . Cleocin [Clindamycin Hcl]   . Erythromycin   . Penicillins   . Serzone [Nefazodone]     VITALS:  Blood pressure (!) 93/59, pulse 73, temperature 97.7 F (36.5 C), temperature source Temporal, resp. rate 18, height 5\' 3"  (1.6 m), weight 55.3 kg, SpO2 97 %.  PHYSICAL EXAMINATION:   Physical Exam  GENERAL:  72 y.o.-year-old patient lying in bed in no acute distress.  EYES: Pupils equal, round, reactive to light and accommodation. No scleral icterus. Extraocular muscles intact.  HEENT: Head atraumatic, normocephalic. Oropharynx and nasopharynx clear.  NECK:  Supple, no jugular venous distention. No thyroid enlargement, no tenderness.  LUNGS: Normal breath sounds bilaterally, no wheezing, rales, rhonchi. No use of accessory muscles of respiration.   CARDIOVASCULAR: S1, S2 normal. No murmurs, rubs, or gallops.  ABDOMEN: Soft, nontender, nondistended. Bowel sounds present. No organomegaly or mass.  EXTREMITIES: No cyanosis, clubbing or edema b/l.  Right lower extremity is shortened and externally rotated due to the fracture. NEUROLOGIC: Cranial nerves II through XII are intact. No focal Motor or sensory deficits b/l.   PSYCHIATRIC: The patient is alert and oriented x 3.  SKIN: No obvious rash, lesion, or ulcer.    LABORATORY PANEL:   CBC Recent Labs  Lab 08/04/18 2249  WBC 9.1  HGB 12.1  HCT 34.2*  PLT 276   ------------------------------------------------------------------------------------------------------------------  Chemistries  Recent Labs  Lab 08/04/18 2249  NA 137  K 3.7  CL 103  CO2 28  GLUCOSE 111*  BUN 13  CREATININE 0.80  CALCIUM 9.0  MG 2.2  AST 22  ALT 20  ALKPHOS 91  BILITOT 0.6   ------------------------------------------------------------------------------------------------------------------  Cardiac Enzymes Recent Labs  Lab 08/04/18 2249  TROPONINI <0.03   ------------------------------------------------------------------------------------------------------------------  RADIOLOGY:  Dg Chest 1 View  Result Date: 08/04/2018 CLINICAL DATA:  Fall at home today going down 2 steps. EXAM: CHEST  1 VIEW COMPARISON:  05/06/2010 and chest CT 08/10/2014 FINDINGS: Left-sided pacemaker is present. Lungs are adequately inflated and otherwise clear. Mild cardiomegaly. Moderate size hiatal hernia. No acute fracture. IMPRESSION: No acute findings. Moderate size hiatal hernia. Electronically Signed   By: Elberta Fortis M.D.   On: 08/04/2018 21:46   Dg Hand Complete Right  Result Date: 08/04/2018 CLINICAL DATA:  Fall going down 2 steps that home today with right hand pain. EXAM: RIGHT HAND - COMPLETE 3+ VIEW COMPARISON:  None. FINDINGS: Examination demonstrates moderate degenerate change at the first  carpometacarpal joint with slight lateral subluxation of the first metacarpal on the trapezium. No acute fracture. IMPRESSION: No acute fracture. Electronically Signed   By: Elberta Fortis M.D.   On: 08/04/2018 21:45   Dg Hip Unilat With Pelvis 2-3 Views Right  Result Date: 08/04/2018 CLINICAL DATA:  Fall at home today going down 2 steps with right hip pain. EXAM: DG HIP (WITH OR WITHOUT PELVIS) 2-3V RIGHT COMPARISON:  CT 07/31/2005 FINDINGS: Exam demonstrates minimally displaced right subcapital femoral neck fracture. Mild degenerate change of the spine. Remainder of the exam is unremarkable. IMPRESSION: Minimally displaced right subcapital femoral neck fracture. Electronically Signed   By: Elberta Fortis M.D.   On: 08/04/2018 21:43     ASSESSMENT AND PLAN:   72 year old female with past medical history of COPD, ischemic cardiomyopathy ejection fraction of 40%, status post AICD, hypertension, hyperlipidemia history of C. difficile colitis, osteoarthritis who presents to the hospital after a mechanical fall and noted to have a right hip fracture.  1.  Status post fall and right hip fracture- patient has been cleared from the medical perspective and also cleared by cardiology.  Patient to undergo right hip surgery today. -Continue further care as per orthopedics.  Next line-Lovenox for DVT prophylaxis.  2.  History of ischemic cardia myopathy ejection fraction of 40% status post AICD- clinical patient is not in congestive heart failure having any chest pain. -Continue metoprolol, losartan, Aldactone.  3.  Essential hypertension-continue Toprol, losartan.  4.  GERD-continue Protonix.  5.  Anxiety-continue Xanax as needed.   All the records are reviewed and case discussed with Care Management/Social Worker. Management plans discussed with the patient, family and they are in agreement.  CODE STATUS: Full code  DVT Prophylaxis: Lovenox  TOTAL TIME TAKING CARE OF THIS PATIENT: 30 minutes.    POSSIBLE D/C IN 2-3 DAYS, DEPENDING ON CLINICAL CONDITION.   Houston Siren M.D on 08/05/2018 at 4:25 PM  Between 7am to 6pm - Pager - [phone]  After 6pm go to [email or web address] - Scientist, research (life sciences) [facility]  Office  [phone]  CC: Primary care physician; Lauro Regulus, MD

## 2018-08-05 NOTE — Anesthesia Post-op Follow-up Note (Signed)
Anesthesia QCDR form completed.        

## 2018-08-05 NOTE — Consult Note (Signed)
[facility] Cardiology Consultation Note  Patient ID: Erica Melendez, MRN: [id number], DOB/AGE: 06-07-1946 72 y.o. Admit date: 08/04/2018   Date of Consult: 08/05/2018 Primary Physician: Lauro Regulus, MD Primary Cardiologist: Gwen Pounds  Chief Complaint:  Chief Complaint  Patient presents with  . Fall   Reason for Consult: Vascular disease  HPI: 72 y.o. female with known essential hypertension mixed hyperlipidemia and previous history of left bundle branch block with moderate dilated cardiomyopathy previously on appropriate medication management including spironolactone metoprolol and losartan.  The patient has done very well with this medication management and has has not had any exacerbation of congestive heart failure in the last several years.  She was have a defibrillator biventricular pacemaker placed for left bundle branch block and symptoms of which has been working fairly well with no evidence of defibrillations and or episodes of ventricular tachycardia.  The patient had a interrogation several months ago which was normal.  Recently she has been continuing to do appropriate activities with the medications listed and tripped and fell.  She did not lose consciousness or have a syncope and there was no evidence of new EKG changes.  Currently her troponin is normal and her EKG shows sinus rhythm with ventricular pacing.  The patient does feel well at this time other than hip pain which needs further intervention and surgical treatment.  She will be at low risk for cardiovascular complication with surgical intervention with orthopedics.  Past Medical History:  Diagnosis Date  . Anemia   . Arthritis   . Cardiomyopathy (HCC)   . Clostridium difficile colitis 1996  . COPD (chronic obstructive pulmonary disease) (HCC)   . Depression   . Diverticulosis   . Erosive gastritis 01/14/2015  . GERD (gastroesophageal reflux disease)   . Hemorrhoids   . History of chickenpox   .  History of hiatal hernia   . Hyperlipidemia   . LBBB (left bundle branch block)   . Mitral insufficiency   . Mitral valve prolapse   . Myocardial infarction (HCC)   . Pleurisy   . Stroke [facility])       Surgical History:  Past Surgical History:  Procedure Laterality Date  . BREAST SURGERY    . CHOLECYSTECTOMY    . COLONOSCOPY    . DILATION AND CURETTAGE OF UTERUS    . ESOPHAGOGASTRODUODENOSCOPY (EGD) WITH PROPOFOL N/A 12/09/2015   Procedure: ESOPHAGOGASTRODUODENOSCOPY (EGD) WITH PROPOFOL;  Surgeon: Christena Deem, MD;  Location: [facility] ENDOSCOPY;  Service: Endoscopy;  Laterality: N/A;  . SHOULDER ARTHROSCOPY WITH ROTATOR CUFF REPAIR Left 07/27/2011     Home Meds: Prior to Admission medications   Medication Sig Start Date End Date Taking? Authorizing Provider  ALPRAZolam (XANAX) 0.25 MG tablet Take 0.25 mg by mouth 2 (two) times daily.   Yes [provider]  losartan (COZAAR) 25 MG tablet Take 25 mg by mouth daily.   Yes [provider]  metoprolol succinate (TOPROL-XL) 25 MG 24 hr tablet Take 25 mg by mouth daily.   Yes [provider]  pantoprazole (PROTONIX) 40 MG tablet Take 40 mg by mouth daily.   Yes [provider]  spironolactone (ALDACTONE) 25 MG tablet Take 25 mg by mouth daily.   Yes [provider]    Inpatient Medications:  . ALPRAZolam  0.25 mg Oral BID  . enoxaparin (LOVENOX) injection  40 mg Subcutaneous Q24H  . [START ON 08/06/2018] Influenza vac split quadrivalent PF  0.5 mL Intramuscular Tomorrow-1000  . losartan  25 mg Oral Daily  . metoprolol succinate  25 mg Oral Daily  . pantoprazole  40 mg Oral Daily  . spironolactone  25 mg Oral Daily     Allergies:  Allergies  Allergen Reactions  . Cleocin [Clindamycin Hcl]   . Erythromycin   . Penicillins   . Serzone [Nefazodone]     Social History   Socioeconomic History  . Marital status: Married    Spouse name: Not on file  . Number of children: Not on file   . Years of education: Not on file  . Highest education level: Not on file  Occupational History  . Not on file  Social Needs  . Financial resource strain: Not on file  . Food insecurity:    Worry: Not on file    Inability: Not on file  . Transportation needs:    Medical: Not on file    Non-medical: Not on file  Tobacco Use  . Smoking status: Former Smoker    Packs/day: 1.00    Years: 20.00    Pack years: 20.00    Types: Cigarettes    Last attempt to quit: 01/17/1993    Years since quitting: 25.5  . Smokeless tobacco: Never Used  Substance and Sexual Activity  . Alcohol use: No  . Drug use: No  . Sexual activity: Not on file  Lifestyle  . Physical activity:    Days per week: Not on file    Minutes per session: Not on file  . Stress: Not on file  Relationships  . Social connections:    Talks on phone: Not on file    Gets together: Not on file    Attends religious service: Not on file    Active member of club or organization: Not on file    Attends meetings of clubs or organizations: Not on file    Relationship status: Not on file  . Intimate partner violence:    Fear of current or ex partner: Not on file    Emotionally abused: Not on file    Physically abused: Not on file    Forced sexual activity: Not on file  Other Topics Concern  . Not on file  Social History Narrative  . Not on file     History reviewed. No pertinent family history.   Review of Systems Positive for hip pain limb pain Negative for: General:  chills, fever, night sweats or weight changes.  Cardiovascular: PND orthopnea syncope dizziness  Dermatological skin lesions rashes Respiratory: Cough congestion Urologic: Frequent urination urination at night and hematuria Abdominal: negative for nausea, vomiting, diarrhea, bright red blood per rectum, melena, or hematemesis Neurologic: negative for visual changes, and/or hearing changes  All other systems reviewed and are otherwise negative except  as noted above.  Labs: Recent Labs    08/04/18 2249  TROPONINI <0.03   Lab Results  Component Value Date   WBC 9.1 08/04/2018   HGB 12.1 08/04/2018   HCT 34.2 (L) 08/04/2018   MCV 91.6 08/04/2018   PLT 276 08/04/2018    Recent Labs  Lab 08/04/18 2249  NA 137  K 3.7  CL 103  CO2 28  BUN 13  CREATININE 0.80  CALCIUM 9.0  PROT 6.8  BILITOT 0.6  ALKPHOS 91  ALT 20  AST 22  GLUCOSE 111*   No results found for: CHOL, HDL, LDLCALC, TRIG No results found for: DDIMER  Radiology/Studies:  Dg Chest 1 View  Result Date: 08/04/2018 CLINICAL DATA:  Fall at home today going down 2 steps. EXAM: CHEST  1 VIEW COMPARISON:  05/06/2010 and chest CT 08/10/2014 FINDINGS: Left-sided pacemaker is present. Lungs are adequately inflated and otherwise clear. Mild cardiomegaly. Moderate size hiatal hernia. No acute fracture. IMPRESSION: No acute findings. Moderate size hiatal hernia. Electronically Signed   By: Elberta Fortis M.D.   On: 08/04/2018 21:46   Dg Hand Complete Right  Result Date: 08/04/2018 CLINICAL DATA:  Fall going down 2 steps that home today with right hand pain. EXAM: RIGHT HAND - COMPLETE 3+ VIEW COMPARISON:  None. FINDINGS: Examination demonstrates moderate degenerate change at the first carpometacarpal joint with slight lateral subluxation of the first metacarpal on the trapezium. No acute fracture. IMPRESSION: No acute fracture. Electronically Signed   By: Elberta Fortis M.D.   On: 08/04/2018 21:45   Dg Hip Unilat With Pelvis 2-3 Views Right  Result Date: 08/04/2018 CLINICAL DATA:  Fall at home today going down 2 steps with right hip pain. EXAM: DG HIP (WITH OR WITHOUT PELVIS) 2-3V RIGHT COMPARISON:  CT 07/31/2005 FINDINGS: Exam demonstrates minimally displaced right subcapital femoral neck fracture. Mild degenerate change of the spine. Remainder of the exam is unremarkable. IMPRESSION: Minimally displaced right subcapital femoral neck fracture. Electronically Signed   By:  Elberta Fortis M.D.   On: 08/04/2018 21:43    EKG: Normal sinus rhythm with ventricular pacing  Weights: Filed Weights   08/04/18 2043  Weight: 55.3 kg     Physical Exam: Blood pressure (!) 106/58, pulse 79, temperature 98.2 F (36.8 C), temperature source Oral, resp. rate 16, height 5\' 3"  (1.6 m), weight 55.3 kg, SpO2 100 %. Body mass index is 21.61 kg/m. General: Well developed, well nourished, in no acute distress. Head eyes ears nose throat: Normocephalic, atraumatic, sclera non-icteric, no xanthomas, nares are without discharge. No apparent thyromegaly and/or mass  Lungs: Normal respiratory effort.  no wheezes, no rales, no rhonchi.  Heart: RRR with normal S1 S2. no murmur gallop, no rub, PMI is normal size and placement, carotid upstroke normal without bruit, jugular venous pressure is normal Abdomen: Soft, non-tender, non-distended with normoactive bowel sounds. No hepatomegaly. No rebound/guarding. No obvious abdominal masses. Abdominal aorta is normal size without bruit Extremities: No edema. no cyanosis, no clubbing, no ulcers  Peripheral : 2+ bilateral upper extremity pulses, 2+ bilateral femoral pulses, 2+ bilateral dorsal pedal pulse Neuro: Alert and oriented. No facial asymmetry. No focal deficit. Moves all extremities spontaneously. Musculoskeletal: Normal muscle tone without kyphosis Psych:  Responds to questions appropriately with a normal affect.    Assessment: 72 year old female with idiopathic dilated cardiomyopathy on appropriate medication management for hypertension status post previously previous biventricular pacer defibrillator placement doing very well from the cardiovascular standpoint currently with hip fracture needing surgical intervention for which she is at low risk for this procedure and optimally medically managed to continue low risk throughout surgery and rehabilitation  Plan: 1.  Proceed to surgery without restriction to surgical intervention  anesthesia and rehabilitation 2.  Continue medication management for cardiomyopathy including medications listed above 3.  No intervention or reprogramming for defibrillator pacemaker at this time 4.  No further cardiac diagnostics necessary at this time due to no significant symptoms of cardiovascular disease 5.  Call if further questions  Signed, Lamar Blinks M.D. [facility] Cardiology 08/05/2018, 10:41 AM

## 2018-08-05 NOTE — Progress Notes (Signed)
*  PRELIMINARY RESULTS* Echocardiogram 2D Echocardiogram has been performed.  Erica Melendez 08/05/2018, 9:40 AM

## 2018-08-05 NOTE — Clinical Social Work Placement (Signed)
   CLINICAL SOCIAL WORK PLACEMENT  NOTE  Date:  08/05/2018  Patient Details  Name: Erica Melendez MRN: [id number] Date of Birth: 11-04-1946  Clinical Social Work is seeking post-discharge placement for this patient at the Skilled  Nursing Facility level of care (*CSW will initial, date and re-position this form in  chart as items are completed):  Yes   Patient/family provided with [place] Clinical Social Work Department's list of facilities offering this level of care within the geographic area requested by the patient (or if unable, by the patient's family).  Yes   Patient/family informed of their freedom to choose among providers that offer the needed level of care, that participate in Medicare, Medicaid or managed care program needed by the patient, have an available bed and are willing to accept the patient.  Yes   Patient/family informed of [facility]'s ownership interest in [facility] and [facility], as well as of the fact that they are under no obligation to receive care at these facilities.  PASRR submitted to EDS on 08/05/18     PASRR number received on 08/05/18     Existing PASRR number confirmed on       FL2 transmitted to all facilities in geographic area requested by pt/family on 08/05/18     FL2 transmitted to all facilities within larger geographic area on       Patient informed that his/her managed care company has contracts with or will negotiate with certain facilities, including the following:            Patient/family informed of bed offers received.  Patient chooses bed at       Physician recommends and patient chooses bed at      Patient to be transferred to   on  .  Patient to be transferred to facility by       Patient family notified on   of transfer.  Name of family member notified:        PHYSICIAN       Additional Comment:    _______________________________________________ Lonie Newsham, Darleen Crocker, LCSW 08/05/2018, 12:35 PM

## 2018-08-05 NOTE — Progress Notes (Addendum)
Pt BP 80/47. Dr. Salley Hews notified. Order for 500cc bolus obtained.

## 2018-08-05 NOTE — Anesthesia Preprocedure Evaluation (Addendum)
Anesthesia Evaluation  Patient identified by MRN, date of birth, ID band Patient awake    Reviewed: Allergy & Precautions, H&P , NPO status , Patient's Chart, lab work & pertinent test results  Airway Mallampati: III  TM Distance: >3 FB Neck ROM: full    Dental  (+) Teeth Intact Implants :   Pulmonary neg pulmonary ROS, COPD, former smoker,    breath sounds clear to auscultation       Cardiovascular + Past MI and +CHF  negative cardio ROS  + dysrhythmias (LBBB) + pacemaker + Cardiac Defibrillator (defibrillator biventricular pacemaker placed for LBBB)  Rhythm:regular Rate:Normal  Echo from 08/05/18: - EF 45% to 50%. - normal valves - Pulmonary arteries: PA peak pressure: 37 mm Hg (S).   Neuro/Psych PSYCHIATRIC DISORDERS Depression CVA negative neurological ROS  negative psych ROS   GI/Hepatic negative GI ROS, Neg liver ROS, hiatal hernia, PUD, GERD  Medicated and Controlled,  Endo/Other  negative endocrine ROS  Renal/GU      Musculoskeletal  (+) Arthritis ,   Abdominal   Peds  Hematology negative hematology ROS (+) anemia ,   Anesthesia Other Findings Past Medical History: No date: Anemia No date: Arthritis No date: Cardiomyopathy [facility]) 1996: Clostridium difficile colitis No date: COPD (chronic obstructive pulmonary disease) (HCC) No date: Depression No date: Diverticulosis 01/14/2015: Erosive gastritis No date: GERD (gastroesophageal reflux disease) No date: Hemorrhoids No date: History of chickenpox No date: History of hiatal hernia No date: Hyperlipidemia No date: LBBB (left bundle branch block) No date: Mitral insufficiency No date: Mitral valve prolapse No date: Myocardial infarction (HCC) No date: Pleurisy No date: Stroke [facility])  Past Surgical History: No date: BREAST SURGERY No date: CHOLECYSTECTOMY No date: COLONOSCOPY No date: DILATION AND CURETTAGE OF UTERUS 12/09/2015:  ESOPHAGOGASTRODUODENOSCOPY (EGD) WITH PROPOFOL; N/A     Comment:  Procedure: ESOPHAGOGASTRODUODENOSCOPY (EGD) WITH               PROPOFOL;  Surgeon: Christena Deem, MD;  Location:               [facility] ENDOSCOPY;  Service: Endoscopy;  Laterality: N/A; 07/27/2011: SHOULDER ARTHROSCOPY WITH ROTATOR CUFF REPAIR; Left  BMI    Body Mass Index:  21.61 kg/m      Reproductive/Obstetrics negative OB ROS                         Anesthesia Physical Anesthesia Plan  ASA: IV  Anesthesia Plan: Spinal   Post-op Pain Management:    Induction:   PONV Risk Score and Plan: Propofol infusion and TIVA  Airway Management Planned:   Additional Equipment:   Intra-op Plan:   Post-operative Plan:   Informed Consent: I have reviewed the patients History and Physical, chart, labs and discussed the procedure including the risks, benefits and alternatives for the proposed anesthesia with the patient or authorized representative who has indicated his/her understanding and acceptance.   Dental Advisory Given  Plan Discussed with: Anesthesiologist, CRNA and Surgeon  Anesthesia Plan Comments:         Anesthesia Quick Evaluation

## 2018-08-05 NOTE — Plan of Care (Signed)

## 2018-08-06 LAB — CBC
HCT: 26.4 % — ABNORMAL LOW (ref 35.0–47.0)
Hemoglobin: 9.5 g/dL — ABNORMAL LOW (ref 12.0–16.0)
MCH: 33.1 pg (ref 26.0–34.0)
MCHC: 35.9 g/dL (ref 32.0–36.0)
MCV: 92.4 fL (ref 80.0–100.0)
PLATELETS: 198 10*3/uL (ref 150–440)
RBC: 2.86 MIL/uL — ABNORMAL LOW (ref 3.80–5.20)
RDW: 13.4 % (ref 11.5–14.5)
WBC: 7.5 10*3/uL (ref 3.6–11.0)

## 2018-08-06 LAB — BASIC METABOLIC PANEL
ANION GAP: 4 — AB (ref 5–15)
BUN: 9 mg/dL (ref 8–23)
CALCIUM: 7.8 mg/dL — AB (ref 8.9–10.3)
CO2: 25 mmol/L (ref 22–32)
Chloride: 107 mmol/L (ref 98–111)
Creatinine, Ser: 0.7 mg/dL (ref 0.44–1.00)
GFR calc Af Amer: >60 mL/min (ref >60)
GLUCOSE: 119 mg/dL — AB (ref 70–99)
Potassium: 4.4 mmol/L (ref 3.5–5.1)
SODIUM: 136 mmol/L (ref 135–145)

## 2018-08-06 NOTE — Progress Notes (Signed)
Patient ID: Erica Melendez, female   DOB: 08/18/1946, 72 y.o.   MRN: [id number]  [employer] [facility] PROGRESS NOTE  Erica Melendez:[id number] DOB: 12-12-1945 DOA: 08/04/2018 PCP: Lauro Regulus, MD  HPI/Subjective: Patient in the process of moving from here to [facility].  Family interested in rehab in the [place] area.  Objective: Vitals:   08/06/18 1213 08/06/18 1542  BP: 112/61 100/83  Pulse: 95 83  Resp: 18 18  Temp: 99.1 F (37.3 C) 98.4 F (36.9 C)  SpO2: 99% 95%    Filed Weights   08/04/18 2043 08/05/18 1336  Weight: 55.3 kg 55.3 kg    ROS: Review of Systems  Constitutional: Negative for chills and fever.  Eyes: Negative for blurred vision.  Respiratory: Negative for cough and shortness of breath.   Cardiovascular: Negative for chest pain.  Gastrointestinal: Negative for abdominal pain, constipation, diarrhea, nausea and vomiting.  Genitourinary: Negative for dysuria.  Musculoskeletal: Positive for joint pain.  Neurological: Negative for dizziness and headaches.   Exam: Physical Exam  Constitutional: She is oriented to person, place, and time.  HENT:  Nose: No mucosal edema.  Mouth/Throat: No oropharyngeal exudate or posterior oropharyngeal edema.  Eyes: Pupils are equal, round, and reactive to light. Conjunctivae, EOM and lids are normal.  Neck: No JVD present. Carotid bruit is not present. No edema present. No thyroid mass and no thyromegaly present.  Cardiovascular: S1 normal and S2 normal. Exam reveals no gallop.  No murmur heard. Pulses:      Dorsalis pedis pulses are 2+ on the right side, and 2+ on the left side.  Respiratory: No respiratory distress. She has no wheezes. She has no rhonchi. She has no rales.  GI: Soft. Bowel sounds are normal. There is no tenderness.  Musculoskeletal:       Right ankle: She exhibits no swelling.       Left ankle: She exhibits no swelling.  Lymphadenopathy:    She has no cervical adenopathy.  Neurological:  She is alert and oriented to person, place, and time. No cranial nerve deficit.  Skin: Skin is warm. No rash noted. Nails show no clubbing.  Psychiatric: She has a normal mood and affect.      Data Reviewed: Basic Metabolic Panel: Recent Labs  Lab 08/04/18 2249 08/05/18 1938 08/06/18 0407  NA 137  --  136  K 3.7  --  4.4  CL 103  --  107  CO2 28  --  25  GLUCOSE 111*  --  119*  BUN 13  --  9  CREATININE 0.80 0.80 0.70  CALCIUM 9.0  --  7.8*  MG 2.2  --   --   PHOS 3.2  --   --    Liver Function Tests: Recent Labs  Lab 08/04/18 2249  AST 22  ALT 20  ALKPHOS 91  BILITOT 0.6  PROT 6.8  ALBUMIN 4.0   CBC: Recent Labs  Lab 08/04/18 2249 08/05/18 1938 08/06/18 0407  WBC 9.1 10.6 7.5  NEUTROABS 6.5  --   --   HGB 12.1 11.4* 9.5*  HCT 34.2* 32.5* 26.4*  MCV 91.6 91.9 92.4  PLT 276 250 198   Cardiac Enzymes: Recent Labs  Lab 08/04/18 2249  TROPONINI <0.03     Recent Results (from the past 240 hour(s))  MRSA PCR Screening     Status: None   Collection Time: 08/05/18 11:41 AM  Result Value Ref Range Status   MRSA by PCR NEGATIVE NEGATIVE  Final    Comment:        The GeneXpert MRSA Assay (FDA approved for NASAL specimens only), is one component of a comprehensive MRSA colonization surveillance program. It is not intended to diagnose MRSA infection nor to guide or monitor treatment for MRSA infections. Performed at [facility], [address] [place], [place] [address]      Studies: Dg Chest 1 View  Result Date: 08/04/2018 CLINICAL DATA:  Fall at home today going down 2 steps. EXAM: CHEST  1 VIEW COMPARISON:  05/06/2010 and chest CT 08/10/2014 FINDINGS: Left-sided pacemaker is present. Lungs are adequately inflated and otherwise clear. Mild cardiomegaly. Moderate size hiatal hernia. No acute fracture. IMPRESSION: No acute findings. Moderate size hiatal hernia. Electronically Signed   By: Elberta Fortis M.D.   On: 08/04/2018 21:46   Dg  Pelvis Portable  Result Date: 08/05/2018 CLINICAL DATA:  Hip replacement. EXAM: PORTABLE PELVIS 1-2 VIEWS COMPARISON:  CT 07/31/2005 and intraoperative studies earlier on the same day. FINDINGS: Supine portable view centered over the hips. Uncemented right bipolar hip arthroplasty with postop change involving the adjacent soft tissues. Skin staples project over the right hip. No immediate postoperative complications. Contralateral native left hip appears intact without acute osseous abnormality. IMPRESSION: New right bipolar hip arthroplasty without complicating features. Electronically Signed   By: Tollie Eth M.D.   On: 08/05/2018 18:45   Dg Hand Complete Right  Result Date: 08/04/2018 CLINICAL DATA:  Fall going down 2 steps that home today with right hand pain. EXAM: RIGHT HAND - COMPLETE 3+ VIEW COMPARISON:  None. FINDINGS: Examination demonstrates moderate degenerate change at the first carpometacarpal joint with slight lateral subluxation of the first metacarpal on the trapezium. No acute fracture. IMPRESSION: No acute fracture. Electronically Signed   By: Elberta Fortis M.D.   On: 08/04/2018 21:45   Dg Hip Operative Unilat W Or W/o Pelvis Right  Result Date: 08/05/2018 CLINICAL DATA:  Hep placement for a right hip fracture. EXAM: OPERATIVE RIGHT HIP (WITH PELVIS IF PERFORMED) 4 VIEWS TECHNIQUE: Fluoroscopic spot image(s) were submitted for interpretation post-operatively. COMPARISON:  08/04/2018. FINDINGS: For AP C-arm images of the right hip demonstrate placement of a right hip prosthesis in satisfactory position and alignment. No fracture or dislocation seen. IMPRESSION: Satisfactory appearance of a right hip prosthesis. Electronically Signed   By: Beckie Salts M.D.   On: 08/05/2018 17:33   Dg Hip Unilat With Pelvis 2-3 Views Right  Result Date: 08/04/2018 CLINICAL DATA:  Fall at home today going down 2 steps with right hip pain. EXAM: DG HIP (WITH OR WITHOUT PELVIS) 2-3V RIGHT COMPARISON:  CT  07/31/2005 FINDINGS: Exam demonstrates minimally displaced right subcapital femoral neck fracture. Mild degenerate change of the spine. Remainder of the exam is unremarkable. IMPRESSION: Minimally displaced right subcapital femoral neck fracture. Electronically Signed   By: Elberta Fortis M.D.   On: 08/04/2018 21:43    Scheduled Meds: . ALPRAZolam  0.25 mg Oral BID  . docusate sodium  100 mg Oral BID  . enoxaparin (LOVENOX) injection  40 mg Subcutaneous Q24H  . Influenza vac split quadrivalent PF  0.5 mL Intramuscular Tomorrow-1000  . pantoprazole  40 mg Oral Daily  . spironolactone  25 mg Oral Daily  . traMADol  50 mg Oral Q6H   Continuous Infusions:  Assessment/Plan:  1. Right hip fracture closed requiring surgical repair.  Patient looks good postoperatively day 1.  Social worker looking into Child psychotherapist near [place]. 2. History of ischemic cardiomyopathy.  Discontinue IV fluids.  Since blood pressure little on the lower side will hold antihypertensive medications. 3. Relative hypotension.  Hold antihypertensive medications today. 4. GERD on Protonix 5. Anxiety on Xanax  Code Status:     Code Status Orders  (From admission, onward)         Start     Ordered   08/05/18 0304  Full code  Continuous     08/05/18 0303        Code Status History    This patient has a current code status but no historical code status.    Advance Directive Documentation     Most Recent Value  Type of Advance Directive  Living will  Pre-existing out of facility DNR order (yellow form or pink MOST form)  -  "MOST" Form in Place?  -     Family Communication: Spoke with family at the bedside Disposition Plan: Likely will not hear back from rehabs until Monday  Consultants:  Orthopedic surgery  Time spent: 28 minutes  Nohelani [employer]

## 2018-08-06 NOTE — Progress Notes (Signed)
Pts BP 94/47. MD Wieting notified. Morning BP meds help. MD Wieting notified no new orders.

## 2018-08-06 NOTE — Evaluation (Signed)
Physical Therapy Evaluation Patient Details Name: Erica Melendez MRN: [id number] DOB: 05/10/1946 Today's Date: 08/06/2018   History of Present Illness  72 yo female with fracture from a fall to R hip was admitted and given a hemiarthroplasty for displaced R femoral neck fracture.  Has WBAT and NO PRECAUTIONS but was in an abd cushion overnight.  PMHx:  L BBB, AICD/PPM, COPD, CHF, EF 40%, anemia, c-diff, depression, mitral insufficiency, MI, pleurisy, stroke, diverticulosis, mitral valve prolapse,   Clinical Impression  Pt was evaluated with plan to send to SNF if possible for further strength and balance training.  Pt had already sustained a fall and does not need to have further risk at home due to lack of strength.  Follow acutely to progress gait and transfers with a focus on safety and independence as tolerated.    Follow Up Recommendations Follow surgeon's recommendation for DC plan and follow-up therapies;SNF    Equipment Recommendations  None recommended by PT    Recommendations for Other Services       Precautions / Restrictions Precautions Precautions: Fall(telemetry) Precaution Comments: pt is fearful of mobility due to pain Restrictions Weight Bearing Restrictions: Yes RLE Weight Bearing: Weight bearing as tolerated      Mobility  Bed Mobility Overal bed mobility: Needs Assistance Bed Mobility: Supine to Sit     Supine to sit: Mod assist     General bed mobility comments: assisted her to get off bed with trunk support and bed pad to pivot   Transfers Overall transfer level: Needs assistance Equipment used: Rolling walker (2 wheeled);1 person hand held assist Transfers: Sit to/from Stand Sit to Stand: Mod assist         General transfer comment: significant difficulty controlling backward leaning, sat with poor control iniitally  Ambulation/Gait Ambulation/Gait assistance: Min assist Gait Distance (Feet): 5 Feet Assistive device: Rolling walker (2  wheeled);1 person hand held assist Gait Pattern/deviations: Step-to pattern;Decreased stride length;Wide base of support;Antalgic;Trunk flexed;Decreased weight shift to right;Decreased stance time - right Gait velocity: reduced Gait velocity interpretation: <1.8 ft/sec, indicate of risk for recurrent falls General Gait Details: pt is barely able to wb on RLE but eventually was able to wb enough to transfer to chair  Stairs            Wheelchair Mobility    Modified Rankin (Stroke Patients Only)       Balance Overall balance assessment: Needs assistance Sitting-balance support: Feet supported;Bilateral upper extremity supported Sitting balance-Leahy Scale: Fair     Standing balance support: Bilateral upper extremity supported;During functional activity Standing balance-Leahy Scale: Poor                               Pertinent Vitals/Pain Pain Assessment: 0-10 Pain Score: 8  Pain Location: R hip Pain Descriptors / Indicators: Operative site guarding;Grimacing Pain Intervention(s): Limited activity within patient's tolerance;Monitored during session;Premedicated before session;Repositioned;Ice applied    Home Living Family/patient expects to be discharged to:: Private residence Living Arrangements: Spouse/significant other Available Help at Discharge: Family;Available 24 hours/day Type of Home: House Home Access: Stairs to enter   [employer] of Steps: 1 Home Layout: One level Home Equipment: Cane - single point Additional Comments: has been home disassembling her house to move out    Prior Function Level of Independence: Independent with assistive device(s)               Hand Dominance   Dominant Hand:  Right    Extremity/Trunk Assessment   Upper Extremity Assessment Upper Extremity Assessment: Generalized weakness    Lower Extremity Assessment Lower Extremity Assessment: Generalized weakness;RLE deficits/detail RLE Deficits /  Details: new R hemiarthroplasty with abd cushion and anterior approach per op report-MD is asking to leave abd splint off now RLE Coordination: decreased fine motor;decreased gross motor    Cervical / Trunk Assessment Cervical / Trunk Assessment: Normal  Communication   Communication: No difficulties  Cognition Arousal/Alertness: Awake/alert Behavior During Therapy: WFL for tasks assessed/performed Overall Cognitive Status: Within Functional Limits for tasks assessed                                 General Comments: pt has moments with being logically impaired but is able to follow instructions from PT      General Comments General comments (skin integrity, edema, etc.): R hip incision on anterior hip, no excessive edema and applied ice for pain    Exercises     Assessment/Plan    PT Assessment Patient needs continued PT services  PT Problem List Decreased range of motion;Decreased strength;Decreased activity tolerance;Decreased balance;Decreased mobility;Decreased coordination;Decreased cognition;Decreased knowledge of use of DME;Decreased safety awareness;Decreased knowledge of precautions;Decreased skin integrity;Pain       PT Treatment Interventions DME instruction;Gait training;Functional mobility training;Neuromuscular re-education;Balance training;Stair training;Therapeutic activities;Therapeutic exercise;Patient/family education    PT Goals (Current goals can be found in the Care Plan section)  Acute Rehab PT Goals Patient Stated Goal: to get home ASAP PT Goal Formulation: With patient Time For Goal Achievement: 08/20/18 Potential to Achieve Goals: Good    Frequency BID   Barriers to discharge Inaccessible home environment;Decreased caregiver support home with one step to enter and can barely transition to chair    Co-evaluation               AM-PAC PT "6 Clicks" Daily Activity  Outcome Measure Difficulty turning over in bed (including  adjusting bedclothes, sheets and blankets)?: Unable Difficulty moving from lying on back to sitting on the side of the bed? : Unable Difficulty sitting down on and standing up from a chair with arms (e.g., wheelchair, bedside commode, etc,.)?: Unable Help needed moving to and from a bed to chair (including a wheelchair)?: A Lot Help needed walking in hospital room?: A Little Help needed climbing 3-5 steps with a railing? : A Lot 6 Click Score: 10    End of Session Equipment Utilized During Treatment: Gait belt Activity Tolerance: Patient limited by fatigue;Patient limited by pain Patient left: in chair;with call bell/phone within reach;with chair alarm set Nurse Communication: Mobility status PT Visit Diagnosis: Unsteadiness on feet (R26.81);Muscle weakness (generalized) (M62.81);Difficulty in walking, not elsewhere classified (R26.2);Pain Pain - Right/Left: Right Pain - part of body: Hip    Time: 4540-9811 PT Time Calculation (min) (ACUTE ONLY): 44 min   Charges:   PT Evaluation $PT Eval Moderate Complexity: 1 Mod PT Treatments $Gait Training: 8-22 mins $Therapeutic Activity: 8-22 mins       Ivar Drape 08/06/2018, 4:06 PM   Samul Dada, PT MS Acute Rehab Dept. Number: [facility] [id number] and [facility] [phone]

## 2018-08-06 NOTE — Progress Notes (Signed)
Subjective:  Patient reports pain as moderate.   Objective:   VITALS:   Vitals:   08/06/18 0117 08/06/18 0122 08/06/18 0442 08/06/18 0808  BP: (!) 87/45 (!) 100/53 (!) 92/47 (!) 94/47  Pulse: 77  76 82  Resp: 16  20 18   Temp: 98.6 F (37 C)  97.8 F (36.6 C) 99.8 F (37.7 C)  TempSrc: Oral   Oral  SpO2: 96%  92% 94%  Weight:      Height:        PHYSICAL EXAM:  Sensation intact distally Intact pulses distally Dorsiflexion/Plantar flexion intact Incision: dressing C/D/I No cellulitis present Compartment soft  LABS  Results for orders placed or performed during the hospital encounter of 08/04/18 (from the past 24 hour(s))  MRSA PCR Screening     Status: None   Collection Time: 08/05/18 11:41 AM  Result Value Ref Range   MRSA by PCR NEGATIVE NEGATIVE  CBC     Status: Abnormal   Collection Time: 08/05/18  7:38 PM  Result Value Ref Range   WBC 10.6 3.6 - 11.0 K/uL   RBC 3.54 (L) 3.80 - 5.20 MIL/uL   Hemoglobin 11.4 (L) 12.0 - 16.0 g/dL   HCT 78.2 (L) 95.6 - 21.3 %   MCV 91.9 80.0 - 100.0 fL   MCH 32.2 26.0 - 34.0 pg   MCHC 35.0 32.0 - 36.0 g/dL   RDW 08.6 57.8 - 46.9 %   Platelets 250 150 - 440 K/uL  Creatinine, serum     Status: None   Collection Time: 08/05/18  7:38 PM  Result Value Ref Range   Creatinine, Ser 0.80 0.44 - 1.00 mg/dL   GFR calc non Af Amer >60 >60 mL/min   GFR calc Af Amer >60 >60 mL/min  CBC     Status: Abnormal   Collection Time: 08/06/18  4:07 AM  Result Value Ref Range   WBC 7.5 3.6 - 11.0 K/uL   RBC 2.86 (L) 3.80 - 5.20 MIL/uL   Hemoglobin 9.5 (L) 12.0 - 16.0 g/dL   HCT 62.9 (L) 52.8 - 41.3 %   MCV 92.4 80.0 - 100.0 fL   MCH 33.1 26.0 - 34.0 pg   MCHC 35.9 32.0 - 36.0 g/dL   RDW 24.4 01.0 - 27.2 %   Platelets 198 150 - 440 K/uL  Basic metabolic panel     Status: Abnormal   Collection Time: 08/06/18  4:07 AM  Result Value Ref Range   Sodium 136 135 - 145 mmol/L   Potassium 4.4 3.5 - 5.1 mmol/L   Chloride 107 98 - 111 mmol/L   CO2 25 22 - 32 mmol/L   Glucose, Bld 119 (H) 70 - 99 mg/dL   BUN 9 8 - 23 mg/dL   Creatinine, Ser 5.36 0.44 - 1.00 mg/dL   Calcium 7.8 (L) 8.9 - 10.3 mg/dL   GFR calc non Af Amer >60 >60 mL/min   GFR calc Af Amer >60 >60 mL/min   Anion gap 4 (L) 5 - 15    Dg Chest 1 View  Result Date: 08/04/2018 CLINICAL DATA:  Fall at home today going down 2 steps. EXAM: CHEST  1 VIEW COMPARISON:  05/06/2010 and chest CT 08/10/2014 FINDINGS: Left-sided pacemaker is present. Lungs are adequately inflated and otherwise clear. Mild cardiomegaly. Moderate size hiatal hernia. No acute fracture. IMPRESSION: No acute findings. Moderate size hiatal hernia. Electronically Signed   By: Elberta Fortis M.D.   On: 08/04/2018 21:46   Dg  Pelvis Portable  Result Date: 08/05/2018 CLINICAL DATA:  Hip replacement. EXAM: PORTABLE PELVIS 1-2 VIEWS COMPARISON:  CT 07/31/2005 and intraoperative studies earlier on the same day. FINDINGS: Supine portable view centered over the hips. Uncemented right bipolar hip arthroplasty with postop change involving the adjacent soft tissues. Skin staples project over the right hip. No immediate postoperative complications. Contralateral native left hip appears intact without acute osseous abnormality. IMPRESSION: New right bipolar hip arthroplasty without complicating features. Electronically Signed   By: Tollie Eth M.D.   On: 08/05/2018 18:45   Dg Hand Complete Right  Result Date: 08/04/2018 CLINICAL DATA:  Fall going down 2 steps that home today with right hand pain. EXAM: RIGHT HAND - COMPLETE 3+ VIEW COMPARISON:  None. FINDINGS: Examination demonstrates moderate degenerate change at the first carpometacarpal joint with slight lateral subluxation of the first metacarpal on the trapezium. No acute fracture. IMPRESSION: No acute fracture. Electronically Signed   By: Elberta Fortis M.D.   On: 08/04/2018 21:45   Dg Hip Operative Unilat W Or W/o Pelvis Right  Result Date: 08/05/2018 CLINICAL DATA:   Hep placement for a right hip fracture. EXAM: OPERATIVE RIGHT HIP (WITH PELVIS IF PERFORMED) 4 VIEWS TECHNIQUE: Fluoroscopic spot image(s) were submitted for interpretation post-operatively. COMPARISON:  08/04/2018. FINDINGS: For AP C-arm images of the right hip demonstrate placement of a right hip prosthesis in satisfactory position and alignment. No fracture or dislocation seen. IMPRESSION: Satisfactory appearance of a right hip prosthesis. Electronically Signed   By: Beckie Salts M.D.   On: 08/05/2018 17:33   Dg Hip Unilat With Pelvis 2-3 Views Right  Result Date: 08/04/2018 CLINICAL DATA:  Fall at home today going down 2 steps with right hip pain. EXAM: DG HIP (WITH OR WITHOUT PELVIS) 2-3V RIGHT COMPARISON:  CT 07/31/2005 FINDINGS: Exam demonstrates minimally displaced right subcapital femoral neck fracture. Mild degenerate change of the spine. Remainder of the exam is unremarkable. IMPRESSION: Minimally displaced right subcapital femoral neck fracture. Electronically Signed   By: Elberta Fortis M.D.   On: 08/04/2018 21:43    Assessment/Plan: 1 Day Post-Op   Active Problems:   Closed displaced fracture of right femoral neck (HCC)   Advance diet Up with therapy Discharge to SNF when medically stable. Ok for transfer from orthopedic standpoint. May use aspirin 81 mg BID for DVT prophylaxis after discharge. She is weight bearing as tolerated and will need orthopedic follow-up in 2 weeks for staple removal.   Lyndle Herrlich , MD 08/06/2018, 10:10 AM

## 2018-08-06 NOTE — Progress Notes (Signed)
Pt has not voideed on this shift. Bladder scan done. obtained. Md made aware. Order to in and out the patient obtained.

## 2018-08-06 NOTE — Progress Notes (Signed)
Physical Therapy Treatment Patient Details Name: Erica Melendez MRN: [id number] DOB: 1946-09-11 Today's Date: 08/06/2018    History of Present Illness 72 yo female with fracture from a fall to R hip was admitted and given a hemiarthroplasty for displaced R femoral neck fracture.  Has WBAT and NO PRECAUTIONS but was in an abd cushion overnight.  PMHx:  L BBB, AICD/PPM, COPD, CHF, EF 40%, anemia, c-diff, depression, mitral insufficiency, MI, pleurisy, stroke, diverticulosis, mitral valve prolapse,     PT Comments    Pt was seen for return to bed and family in attendance t alked about transport to [facility] for rehab.  Follow acutely for strength, standing balance, endurance in standing and to help with pain via movement and ice.  Pt is motivated but apparently traumatized by her impending move out of her home.   Follow Up Recommendations  Follow surgeon's recommendation for DC plan and follow-up therapies;SNF     Equipment Recommendations  None recommended by PT    Recommendations for Other Services       Precautions / Restrictions Precautions Precautions: Fall Precaution Comments: pt is fearful of mobility due to pain Restrictions Weight Bearing Restrictions: Yes RLE Weight Bearing: Weight bearing as tolerated    Mobility  Bed Mobility Overal bed mobility: Needs Assistance Bed Mobility: Sit to Supine     Supine to sit: Mod assist     General bed mobility comments: assisted her to get off bed with trunk support and bed pad to pivot   Transfers Overall transfer level: Needs assistance Equipment used: Rolling walker (2 wheeled);1 person hand held assist Transfers: Sit to/from Stand Sit to Stand: Min assist         General transfer comment: improved control of LLE with effort  Ambulation/Gait Ambulation/Gait assistance: Min assist Gait Distance (Feet): 5 Feet Assistive device: Rolling walker (2 wheeled);1 person hand held assist Gait Pattern/deviations: Step-to  pattern;Decreased stride length;Wide base of support;Antalgic;Trunk flexed;Decreased weight shift to right;Decreased stance time - right Gait velocity: reduced Gait velocity interpretation: <1.31 ft/sec, indicative of household ambulator General Gait Details: pt is barely able to wb on RLE but eventually was able to wb enough to transfer to chair   Stairs             Wheelchair Mobility    Modified Rankin (Stroke Patients Only)       Balance Overall balance assessment: Needs assistance Sitting-balance support: Feet supported;Bilateral upper extremity supported Sitting balance-Leahy Scale: Fair     Standing balance support: Bilateral upper extremity supported;During functional activity Standing balance-Leahy Scale: Fair                              Cognition Arousal/Alertness: Awake/alert Behavior During Therapy: WFL for tasks assessed/performed Overall Cognitive Status: Within Functional Limits for tasks assessed                                 General Comments: pt has moments with being logically impaired but is able to follow instructions from PT      Exercises      General Comments General comments (skin integrity, edema, etc.): R hip incision on anterior hip, no excessive edema and applied ice for pain      Pertinent Vitals/Pain Pain Assessment: 0-10 Pain Score: 7  Pain Location: R hip Pain Descriptors / Indicators: Operative site guarding;Grimacing Pain Intervention(s): Monitored during  session;Premedicated before session;Repositioned    Home Living Family/patient expects to be discharged to:: Private residence Living Arrangements: Spouse/significant other Available Help at Discharge: Family;Available 24 hours/day Type of Home: House Home Access: Stairs to enter   Home Layout: One level Home Equipment: Gilmer Mor - single point Additional Comments: has been home disassembling her house to move out    Prior Function Level of  Independence: Independent with assistive device(s)          PT Goals (current goals can now be found in the care plan section) Acute Rehab PT Goals Patient Stated Goal: to get home ASAP PT Goal Formulation: With patient Time For Goal Achievement: 08/20/18 Potential to Achieve Goals: Good Progress towards PT goals: Progressing toward goals    Frequency    BID      PT Plan Current plan remains appropriate    Co-evaluation              AM-PAC PT "6 Clicks" Daily Activity  Outcome Measure  Difficulty turning over in bed (including adjusting bedclothes, sheets and blankets)?: Unable Difficulty moving from lying on back to sitting on the side of the bed? : Unable Difficulty sitting down on and standing up from a chair with arms (e.g., wheelchair, bedside commode, etc,.)?: Unable Help needed moving to and from a bed to chair (including a wheelchair)?: A Little Help needed walking in hospital room?: A Little Help needed climbing 3-5 steps with a railing? : A Lot 6 Click Score: 11    End of Session Equipment Utilized During Treatment: Gait belt Activity Tolerance: Patient limited by fatigue Patient left: in bed;with call bell/phone within reach;with bed alarm set;with family/visitor present Nurse Communication: Mobility status PT Visit Diagnosis: Unsteadiness on feet (R26.81);Muscle weakness (generalized) (M62.81);Difficulty in walking, not elsewhere classified (R26.2);Pain Pain - Right/Left: Right Pain - part of body: Hip     Time: 2956-2130 PT Time Calculation (min) (ACUTE ONLY): 25 min  Charges:  $Gait Training: 8-22 mins $Therapeutic Activity: 8-22 mins                    Ivar Drape 08/06/2018, 4:14 PM    Samul Dada, PT MS Acute Rehab Dept. Number: [facility] [id number] and [facility] [phone]

## 2018-08-06 NOTE — Progress Notes (Signed)
Pt BP 88/51. Dr. Chipper Oman made aware. Order for 500cc bolus obtained. Bolus administered as ordered. Will continue to monitor.

## 2018-08-06 NOTE — Anesthesia Postprocedure Evaluation (Signed)
Anesthesia Post Note  Patient: Erica Melendez  Procedure(s) Performed: ARTHROPLASTY BIPOLAR HIP (HEMIARTHROPLASTY) (Right )  Patient location during evaluation: Nursing Unit Anesthesia Type: Spinal Level of consciousness: awake and alert Pain management: pain level controlled Vital Signs Assessment: post-procedure vital signs reviewed and stable Respiratory status: spontaneous breathing and respiratory function stable Cardiovascular status: stable Postop Assessment: spinal receding Anesthetic complications: no     Last Vitals:  Vitals:   08/06/18 1213 08/06/18 1542  BP: 112/61 100/83  Pulse: 95 83  Resp: 18 18  Temp: 37.3 C 36.9 C  SpO2: 99% 95%    Last Pain:  Vitals:   08/06/18 1542  TempSrc: Oral  PainSc:                  KEPHART,WILLIAM K

## 2018-08-07 LAB — CBC
HCT: 28.1 % — ABNORMAL LOW (ref 35.0–47.0)
HEMOGLOBIN: 10.1 g/dL — AB (ref 12.0–16.0)
MCH: 33.1 pg (ref 26.0–34.0)
MCHC: 35.9 g/dL (ref 32.0–36.0)
MCV: 92.1 fL (ref 80.0–100.0)
Platelets: 200 10*3/uL (ref 150–440)
RBC: 3.05 MIL/uL — AB (ref 3.80–5.20)
RDW: 13.7 % (ref 11.5–14.5)
WBC: 8.5 10*3/uL (ref 3.6–11.0)

## 2018-08-07 MED ORDER — METOPROLOL TARTRATE 25 MG PO TABS
25.0000 mg | ORAL_TABLET | Freq: Two times a day (BID) | ORAL | Status: DC
  Administered 2018-08-07 – 2018-08-08 (×4): 25 mg via ORAL
  Filled 2018-08-07 (×4): qty 1

## 2018-08-07 NOTE — Progress Notes (Signed)
Patient ID: Erica Melendez, female   DOB: March 02, 1946, 72 y.o.   MRN: [id number]  [employer] PROGRESS NOTE  Erica Melendez NWG:[id number] DOB: 12/16/1945 DOA: 08/04/2018 PCP: Lauro Regulus, MD  HPI/Subjective: Patient feeling good.  Offers no complaints.  Heart rate went fast last night and she needs her metoprolol.  Patient having pain in her right hip.  Objective: Vitals:   08/07/18 0457 08/07/18 0809  BP: 97/66 (!) 104/56  Pulse: 94 98  Resp: 18   Temp: 99.4 F (37.4 C) 99.6 F (37.6 C)  SpO2: 96% 95%    Filed Weights   08/04/18 2043 08/05/18 1336  Weight: 55.3 kg 55.3 kg    ROS: Review of Systems  Constitutional: Negative for chills and fever.  Eyes: Negative for blurred vision.  Respiratory: Negative for cough and shortness of breath.   Cardiovascular: Negative for chest pain.  Gastrointestinal: Negative for abdominal pain, constipation, diarrhea, nausea and vomiting.  Genitourinary: Negative for dysuria.  Musculoskeletal: Positive for joint pain.  Neurological: Negative for dizziness and headaches.   Exam: Physical Exam  Constitutional: She is oriented to person, place, and time.  HENT:  Nose: No mucosal edema.  Mouth/Throat: No oropharyngeal exudate or posterior oropharyngeal edema.  Eyes: Pupils are equal, round, and reactive to light. Conjunctivae, EOM and lids are normal.  Neck: No JVD present. Carotid bruit is not present. No edema present. No thyroid mass and no thyromegaly present.  Cardiovascular: S1 normal and S2 normal. Exam reveals no gallop.  No murmur heard. Pulses:      Dorsalis pedis pulses are 2+ on the right side, and 2+ on the left side.  Respiratory: No respiratory distress. She has no wheezes. She has no rhonchi. She has no rales.  GI: Soft. Bowel sounds are normal. There is no tenderness.  Musculoskeletal:       Right ankle: She exhibits no swelling.       Left ankle: She exhibits no swelling.  Lymphadenopathy:    She has no  cervical adenopathy.  Neurological: She is alert and oriented to person, place, and time. No cranial nerve deficit.  Skin: Skin is warm. No rash noted. Nails show no clubbing.  Psychiatric: She has a normal mood and affect.      Data Reviewed: Basic Metabolic Panel: Recent Labs  Lab 08/04/18 2249 08/05/18 1938 08/06/18 0407  NA 137  --  136  K 3.7  --  4.4  CL 103  --  107  CO2 28  --  25  GLUCOSE 111*  --  119*  BUN 13  --  9  CREATININE 0.80 0.80 0.70  CALCIUM 9.0  --  7.8*  MG 2.2  --   --   PHOS 3.2  --   --    Liver Function Tests: Recent Labs  Lab 08/04/18 2249  AST 22  ALT 20  ALKPHOS 91  BILITOT 0.6  PROT 6.8  ALBUMIN 4.0   CBC: Recent Labs  Lab 08/04/18 2249 08/05/18 1938 08/06/18 0407 08/07/18 0449  WBC 9.1 10.6 7.5 8.5  NEUTROABS 6.5  --   --   --   HGB 12.1 11.4* 9.5* 10.1*  HCT 34.2* 32.5* 26.4* 28.1*  MCV 91.6 91.9 92.4 92.1  PLT 276 250 198 200   Cardiac Enzymes: Recent Labs  Lab 08/04/18 2249  TROPONINI <0.03     Recent Results (from the past 240 hour(s))  MRSA PCR Screening     Status: None  Collection Time: 08/05/18 11:41 AM  Result Value Ref Range Status   MRSA by PCR NEGATIVE NEGATIVE Final    Comment:        The GeneXpert MRSA Assay (FDA approved for NASAL specimens only), is one component of a comprehensive MRSA colonization surveillance program. It is not intended to diagnose MRSA infection nor to guide or monitor treatment for MRSA infections. Performed at [facility], [address] [place], [place] [address]      Studies: Dg Pelvis Portable  Result Date: 08/05/2018 CLINICAL DATA:  Hip replacement. EXAM: PORTABLE PELVIS 1-2 VIEWS COMPARISON:  CT 07/31/2005 and intraoperative studies earlier on the same day. FINDINGS: Supine portable view centered over the hips. Uncemented right bipolar hip arthroplasty with postop change involving the adjacent soft tissues. Skin staples project over the right hip.  No immediate postoperative complications. Contralateral native left hip appears intact without acute osseous abnormality. IMPRESSION: New right bipolar hip arthroplasty without complicating features. Electronically Signed   By: Tollie Eth M.D.   On: 08/05/2018 18:45   Dg Hip Operative Unilat W Or W/o Pelvis Right  Result Date: 08/05/2018 CLINICAL DATA:  Hep placement for a right hip fracture. EXAM: OPERATIVE RIGHT HIP (WITH PELVIS IF PERFORMED) 4 VIEWS TECHNIQUE: Fluoroscopic spot image(s) were submitted for interpretation post-operatively. COMPARISON:  08/04/2018. FINDINGS: For AP C-arm images of the right hip demonstrate placement of a right hip prosthesis in satisfactory position and alignment. No fracture or dislocation seen. IMPRESSION: Satisfactory appearance of a right hip prosthesis. Electronically Signed   By: Beckie Salts M.D.   On: 08/05/2018 17:33    Scheduled Meds: . ALPRAZolam  0.25 mg Oral BID  . docusate sodium  100 mg Oral BID  . enoxaparin (LOVENOX) injection  40 mg Subcutaneous Q24H  . Influenza vac split quadrivalent PF  0.5 mL Intramuscular Tomorrow-1000  . metoprolol tartrate  25 mg Oral BID  . pantoprazole  40 mg Oral Daily  . spironolactone  25 mg Oral Daily  . traMADol  50 mg Oral Q6H   Continuous Infusions:  Assessment/Plan:  1. Right hip fracture closed requiring surgical repair.  Social worker looking into Child psychotherapist near [place].  Should be able to get out of the hospital tomorrow.  Still waiting to hear back from rehab facilities. 2. History of ischemic cardiomyopathy.  Continue metoprolol and spironolactone. 3. Relative hypotension, tachycardia.  Patient needs her metoprolol regardless of pressure. 4. GERD on Protonix 5. Anxiety on Xanax  Code Status:     Code Status Orders  (From admission, onward)         Start     Ordered   08/05/18 0304  Full code  Continuous     08/05/18 0303        Code Status History    This patient has a current code  status but no historical code status.    Advance Directive Documentation     Most Recent Value  Type of Advance Directive  Living will  Pre-existing out of facility DNR order (yellow form or pink MOST form)  -  "MOST" Form in Place?  -     Family Communication: Spoke with family at the bedside Disposition Plan: Likely will not hear back from rehabs until Monday  Consultants:  Orthopedic surgery  Time spent: 27 minutes  Ernesha Ramone [employer]

## 2018-08-07 NOTE — Progress Notes (Signed)
Patient was resting in bed on change of shift. At approximately 2030 HR up to 145. Patient appeared anxious and requested Xanax that was due @ Hs. HR was then in 1-teens-120's. After Hx meds, patient fell asleep then woke up approximately 2315 with with HR in the 120's. Report given to floor RN as this writer was floated to another unit.

## 2018-08-07 NOTE — Progress Notes (Signed)
Physical Therapy Treatment Patient Details Name: Erica Melendez MRN: [id number] DOB: 06-18-1946 Today's Date: 08/07/2018    History of Present Illness 72 yo female with fracture from a fall to R hip was admitted and given a hemiarthroplasty for displaced R femoral neck fracture.  Has WBAT and NO PRECAUTIONS but was in an abd cushion overnight.  PMHx:  L BBB, AICD/PPM, COPD, CHF, EF 40%, anemia, c-diff, depression, mitral insufficiency, MI, pleurisy, stroke, diverticulosis, mitral valve prolapse,     PT Comments    Pt received supine in bed. Supine HEP in bed eschewed at pt request d/t urgency to void in BR. Pt given modA for bed mobility, supervision and instruction for safety and technique with RW for AMB TO BR, then back to chair. Pt moving with less pain restriction and less required effort this date. Seated HEP initiated at end of session. Pain well managed overall. Pt assisted with clean up and sock change after incontinency episode.     Follow Up Recommendations  Follow surgeon's recommendation for DC plan and follow-up therapies;SNF     Equipment Recommendations  None recommended by PT    Recommendations for Other Services       Precautions / Restrictions Precautions Precautions: Fall Precaution Comments: pt is fearful of mobility due to pain Restrictions RLE Weight Bearing: Weight bearing as tolerated    Mobility  Bed Mobility Overal bed mobility: Needs Assistance       Supine to sit: Mod assist     General bed mobility comments: assisted her to get off bed with trunk support and bed pad to pivot   Transfers Overall transfer level: Needs assistance Equipment used: Rolling walker (2 wheeled);1 person hand held assist Transfers: Sit to/from Stand Sit to Stand: Min guard;From elevated surface         General transfer comment: improved control of LLE with effort  Ambulation/Gait   Gait Distance (Feet): 22 Feet(1x61ft; 1x44ft ) Assistive device: Rolling  walker (2 wheeled);1 person hand held assist       General Gait Details: VC for small step length.    Stairs             Wheelchair Mobility    Modified Rankin (Stroke Patients Only)       Balance                                            Cognition Arousal/Alertness: Awake/alert Behavior During Therapy: WFL for tasks assessed/performed Overall Cognitive Status: Within Functional Limits for tasks assessed                                 General Comments: pt has moments with being logically impaired but is able to follow instructions from PT      Exercises Total Joint Exercises Long Arc Quad: AROM;15 reps;Both    General Comments        Pertinent Vitals/Pain Pain Assessment: 0-10 Pain Score: 6  Pain Location: R hip Pain Descriptors / Indicators: Operative site guarding;Grimacing Pain Intervention(s): Monitored during session;Limited activity within patient's tolerance;Premedicated before session    Home Living                      Prior Function            PT Goals (  current goals can now be found in the care plan section) Acute Rehab PT Goals Patient Stated Goal: to get home ASAP PT Goal Formulation: With patient Time For Goal Achievement: 08/20/18 Potential to Achieve Goals: Good Progress towards PT goals: Progressing toward goals    Frequency    BID      PT Plan Current plan remains appropriate    Co-evaluation              AM-PAC PT "6 Clicks" Daily Activity  Outcome Measure  Difficulty turning over in bed (including adjusting bedclothes, sheets and blankets)?: A Little Difficulty moving from lying on back to sitting on the side of the bed? : A Little Difficulty sitting down on and standing up from a chair with arms (e.g., wheelchair, bedside commode, etc,.)?: A Lot Help needed moving to and from a bed to chair (including a wheelchair)?: A Little Help needed walking in hospital room?: A  Little Help needed climbing 3-5 steps with a railing? : A Lot 6 Click Score: 16    End of Session Equipment Utilized During Treatment: Gait belt Activity Tolerance: Patient limited by fatigue Patient left: in bed;with call bell/phone within reach;with bed alarm set;with family/visitor present Nurse Communication: Mobility status PT Visit Diagnosis: Unsteadiness on feet (R26.81);Muscle weakness (generalized) (M62.81);Difficulty in walking, not elsewhere classified (R26.2);Pain Pain - Right/Left: Right Pain - part of body: Hip     Time: 1126-1204 PT Time Calculation (min) (ACUTE ONLY): 38 min  Charges:  $Gait Training: 8-22 mins $Therapeutic Activity: 23-37 mins                     12:55 PM, 08/07/18 [name], PT, DPT Physical Therapist - [facility]  [phone] (ASCOM)     Draysen Weygandt C 08/07/2018, 12:46 PM

## 2018-08-08 LAB — CBC
HCT: 29.3 % — ABNORMAL LOW (ref 35.0–47.0)
Hemoglobin: 10.2 g/dL — ABNORMAL LOW (ref 12.0–16.0)
MCH: 32.4 pg (ref 26.0–34.0)
MCHC: 35 g/dL (ref 32.0–36.0)
MCV: 92.6 fL (ref 80.0–100.0)
PLATELETS: 228 10*3/uL (ref 150–440)
RBC: 3.16 MIL/uL — AB (ref 3.80–5.20)
RDW: 13.4 % (ref 11.5–14.5)
WBC: 7.6 10*3/uL (ref 3.6–11.0)

## 2018-08-08 MED ORDER — ACETAMINOPHEN 325 MG PO TABS: 325.0000 mg | ORAL_TABLET | Freq: Four times a day (QID) | ORAL | Status: AC | PRN

## 2018-08-08 MED ORDER — ASPIRIN EC 81 MG PO TBEC: 81.0000 mg | DELAYED_RELEASE_TABLET | Freq: Two times a day (BID) | ORAL | 0 refills | Status: AC

## 2018-08-08 MED ORDER — ALPRAZOLAM 0.25 MG PO TABS: 0.2500 mg | ORAL_TABLET | Freq: Two times a day (BID) | ORAL | 0 refills | Status: DC

## 2018-08-08 MED ORDER — ENOXAPARIN SODIUM 40 MG/0.4ML ~~LOC~~ SOLN: 40.0000 mg | SUBCUTANEOUS | 0 refills | Status: DC

## 2018-08-08 MED ORDER — ALPRAZOLAM 0.25 MG PO TABS: 0.2500 mg | ORAL_TABLET | Freq: Two times a day (BID) | ORAL | 0 refills | Status: AC

## 2018-08-08 MED ORDER — METOPROLOL SUCCINATE ER 25 MG PO TB24: 25.0000 mg | ORAL_TABLET | Freq: Every day | ORAL | Status: AC

## 2018-08-08 MED ORDER — TRAMADOL HCL 50 MG PO TABS: 50.0000 mg | ORAL_TABLET | Freq: Four times a day (QID) | ORAL | 0 refills | Status: AC | PRN

## 2018-08-08 NOTE — Progress Notes (Addendum)
Per [facility] admissions coordinator at [facility] [place] she can make a bed offer and accept patient today in a private room 325. Per [facility] New[facility]er [facility] does not have a contract with [employer] so patient's supplemental [employer]BS will not pick up the co-pays after 20 days.   Clinical Education officer, museum (CSW) met with patient, her daughter [name], her son and husband were also at bedside. CSW made them aware of above. Patient and her family accepted bed offer. Per daughter she will transport patient to [facility] in [place] around 2 pm today. CSW sent D/C summary and prescriptions to [facility] in [place] today. RN will call report. Medicare bundle case manager aware of above. Please reconsult if future social work needs arise. CSW signing off.   [employer], LCSW [phone]

## 2018-08-08 NOTE — Progress Notes (Signed)
Physical Therapy Treatment Patient Details Name: Erica Melendez MRN: [id number] DOB: 01/23/1946 Today's Date: 08/08/2018    History of Present Illness 72 yo female with fracture from a fall to R hip was admitted and given a hemiarthroplasty for displaced R femoral neck fracture.  Has WBAT and NO PRECAUTIONS but was in an abd cushion overnight.  PMHx:  L BBB, AICD/PPM, COPD, CHF, EF 40%, anemia, c-diff, depression, mitral insufficiency, MI, pleurisy, stroke, diverticulosis, mitral valve prolapse,     PT Comments    Patient able to progress walking distance and demonstrate improved gait pattern today.  She also demonstrated confidence with bed mobility.  Pt receptive to HEP when reviewed with PT and able to complete there ex with min-mod VC's and manual cues.  She was unable to produce a number of the pain scale but did say that she feels no pain at rest and then experiences a sharp pain increase when moving.  Pt still requires manual assistance for mobility of LE's when getting to EOB .  She was able to rise from elevated bedside but required VC's for hand placement and use of RW.  She also required heavy VC's for management of RW and WB during ambulation and turning to back to recliner.  Pt will continue to benefit from skilled PT with focus on HEP for strengthening and pain management, activity tolerance and prevention of future falls.  Follow Up Recommendations  Follow surgeon's recommendation for DC plan and follow-up therapies;SNF     Equipment Recommendations  None recommended by PT    Recommendations for Other Services       Precautions / Restrictions Precautions Precautions: Fall Precaution Comments: Fall related admission Restrictions RLE Weight Bearing: Weight bearing as tolerated    Mobility  Bed Mobility Overal bed mobility: Needs Assistance Bed Mobility: Supine to Sit     Supine to sit: Min assist;HOB elevated     General bed mobility comments: Hand held assist to  sit upright; able to assist in moving LE's.  Transfers Overall transfer level: Needs assistance Equipment used: Rolling walker (2 wheeled) Transfers: Sit to/from Stand Sit to Stand: Min guard;From elevated surface         General transfer comment: PT provided reminder of WB status and how to use RW to manage.  Pt was able to rise without physical assistance.  Ambulation/Gait Ambulation/Gait assistance: Min assist Gait Distance (Feet): 7 Feet Assistive device: Rolling walker (2 wheeled)     Gait velocity interpretation: <1.8 ft/sec, indicate of risk for recurrent falls General Gait Details: Step to gait pattern with low foot clearance and slow velocity.  Pt able to back to chair and perform turns with VC's for sequencing and management of RW.   Stairs             Wheelchair Mobility    Modified Rankin (Stroke Patients Only)       Balance Overall balance assessment: Needs assistance Sitting-balance support: Feet supported;Bilateral upper extremity supported Sitting balance-Leahy Scale: Good     Standing balance support: Bilateral upper extremity supported;During functional activity Standing balance-Leahy Scale: Fair                              Cognition Arousal/Alertness: Awake/alert Behavior During Therapy: WFL for tasks assessed/performed Overall Cognitive Status: Within Functional Limits for tasks assessed  General Comments: Pt is better able to follow simple commands and is very talkative; requires frequent redirection and assistance to stay on task.      Exercises Total Joint Exercises Ankle Circles/Pumps: 10 reps;Seated;Both Quad Sets: Right;Strengthening;10 reps;Supine Towel Squeeze: Both;10 reps;Seated;Strengthening Heel Slides: AAROM;Right;10 reps;Supine Hip ABduction/ADduction: Strengthening;Both;10 reps;Seated Other Exercises Other Exercises: Issued an HPE and educated pt regarding  management of schedule.  Pt instructed to read over HEP and prepare questions for afternoon treatment.  x5 min.    General Comments        Pertinent Vitals/Pain Pain Assessment: 0-10 Pain Location: Pt never gave a number when asked but stated that when her hip hurts it hurts "really really bad" and then the pain alleviates at rest. Pain Intervention(s): Monitored during session;Limited activity within patient's tolerance    Home Living                      Prior Function            PT Goals (current goals can now be found in the care plan section) Acute Rehab PT Goals Patient Stated Goal: to get home ASAP PT Goal Formulation: With patient Time For Goal Achievement: 08/20/18 Potential to Achieve Goals: Good Progress towards PT goals: Progressing toward goals    Frequency    BID      PT Plan Current plan remains appropriate    Co-evaluation              AM-PAC PT "6 Clicks" Daily Activity  Outcome Measure  Difficulty turning over in bed (including adjusting bedclothes, sheets and blankets)?: A Little Difficulty moving from lying on back to sitting on the side of the bed? : A Little Difficulty sitting down on and standing up from a chair with arms (e.g., wheelchair, bedside commode, etc,.)?: A Little Help needed moving to and from a bed to chair (including a wheelchair)?: A Little Help needed walking in hospital room?: A Lot Help needed climbing 3-5 steps with a railing? : A Lot 6 Click Score: 16    End of Session Equipment Utilized During Treatment: Gait belt Activity Tolerance: Patient limited by fatigue Patient left: with call bell/phone within reach;with family/visitor present;in chair;with chair alarm set Nurse Communication: Mobility status PT Visit Diagnosis: Unsteadiness on feet (R26.81);Muscle weakness (generalized) (M62.81);Difficulty in walking, not elsewhere classified (R26.2);Pain Pain - Right/Left: Right Pain - part of body: Hip      Time: 9604-5409 PT Time Calculation (min) (ACUTE ONLY): 28 min  Charges:  $Therapeutic Exercise: 8-22 mins $Therapeutic Activity: 8-22 mins                     Glenetta Hew, PT, DPT   Glenetta Hew 08/08/2018, 12:46 PM

## 2018-08-08 NOTE — Clinical Social Work Placement (Signed)
   CLINICAL SOCIAL WORK PLACEMENT  NOTE  Date:  08/08/2018  Patient Details  Name: Erica Melendez MRN: [id number] Date of Birth: 01-19-1946  Clinical Social Work is seeking post-discharge placement for this patient at the Skilled  Nursing Facility level of care (*CSW will initial, date and re-position this form in  chart as items are completed):  Yes   Patient/family provided with [place] [facility] Clinical Social Work Department's list of facilities offering this level of care within the geographic area requested by the patient (or if unable, by the patient's family).  Yes   Patient/family informed of their freedom to choose among providers that offer the needed level of care, that participate in Medicare, Medicaid or managed care program needed by the patient, have an available bed and are willing to accept the patient.  Yes   Patient/family informed of [facility]'s ownership interest in [facility] and [facility], as well as of the fact that they are under no obligation to receive care at these facilities.  PASRR submitted to EDS on 08/05/18     PASRR number received on 08/05/18     Existing PASRR number confirmed on       FL2 transmitted to all facilities in geographic area requested by pt/family on 08/05/18     FL2 transmitted to all facilities within larger geographic area on 08/08/18     Patient informed that his/her managed care company has contracts with or will negotiate with certain facilities, including the following:        Yes   Patient/family informed of bed offers received.  Patient chooses bed at [facility] )     Physician recommends and patient chooses bed at      Patient to be transferred to [facility] ) on 08/08/18.  Patient to be transferred to facility by (Patient's daughter Caro Hight will transport in private vehicle. )     Patient family notified on 08/08/18 of transfer.  Name of family member notified:  (Patient's daughter Caro Hight is  aware of D/C today. )     PHYSICIAN       Additional Comment:    _______________________________________________ Solae Norling, Darleen Crocker, LCSW 08/08/2018, 1:32 PM

## 2018-08-08 NOTE — Progress Notes (Signed)
Called report to [place], Charity fundraiser at [employer] in [place]. Answered all questions.

## 2018-08-08 NOTE — Discharge Summary (Signed)
[facility] [place] at [facility]   PATIENT NAME: Erica Melendez    MR#:  [id number]  DATE OF BIRTH:  08/30/1946  DATE OF ADMISSION:  08/04/2018 ADMITTING PHYSICIAN: Barbaraann Rondo, MD  DATE OF DISCHARGE: 08/08/2018  PRIMARY CARE PHYSICIAN: Lauro Regulus, MD    ADMISSION DIAGNOSIS:  Fall [W19.XXXA] Closed fracture of neck of right femur, initial encounter (HCC) [S72.001A]  DISCHARGE DIAGNOSIS:  Active Problems:   Closed displaced fracture of right femoral neck (HCC)   SECONDARY DIAGNOSIS:   Past Medical History:  Diagnosis Date  . Anemia   . Arthritis   . Cardiomyopathy (HCC)   . Clostridium difficile colitis 1996  . COPD (chronic obstructive pulmonary disease) (HCC)   . Depression   . Diverticulosis   . Erosive gastritis 01/14/2015  . GERD (gastroesophageal reflux disease)   . Hemorrhoids   . History of chickenpox   . History of hiatal hernia   . Hyperlipidemia   . LBBB (left bundle branch block)   . Mitral insufficiency   . Mitral valve prolapse   . Myocardial infarction (HCC)   . Pleurisy   . Stroke [facility])     HOSPITAL COURSE:   1.  Closed displaced fracture of the right femoral neck.  Patient was brought to the operating room on 9/27 by Dr. Odis Luster and had a right hip anterior hemi arthroplasty.  Physical therapy recommended rehab.  Weightbearing as tolerated.  Will need orthopedic follow-up within 2 weeks for staple removal.  Pain control with tramadol.  DVT prophylaxis with aspirin 81 mg twice daily for 6 weeks as per orthopedic surgery. 2.  History of ischemic cardiomyopathy.  Continue Toprol, spironolactone and losartan. 3.  Relative hypotension and tachycardia.  Hemoglobin was stable.  I initially held the patient's metoprolol but this needs to be given secondary to tachycardia without it.  With ischemic cardiomyopathy we do like blood pressure on the lower side as long as she is asymptomatic. 4.  GERD on Protonix 5.  Anxiety on  Xanax  DISCHARGE CONDITIONS:   Satisfactory  CONSULTS OBTAINED:  Treatment Team:  Barbaraann Rondo, MD Lamar Blinks, MD Lyndle Herrlich, MD  DRUG ALLERGIES:   Allergies  Allergen Reactions  . Cleocin [Clindamycin Hcl]   . Erythromycin   . Penicillins   . Serzone [Nefazodone]     DISCHARGE MEDICATIONS:   Allergies as of 08/08/2018      Reactions   Cleocin [clindamycin Hcl]    Erythromycin    Penicillins    Serzone [nefazodone]       Medication List    TAKE these medications   acetaminophen 325 MG tablet Commonly known as:  TYLENOL Take 1-2 tablets (325-650 mg total) by mouth every 6 (six) hours as needed for mild pain (pain score 1-3 or temp > 100.5).   ALPRAZolam 0.25 MG tablet Commonly known as:  XANAX Take 1 tablet (0.25 mg total) by mouth 2 (two) times daily.   aspirin EC 81 MG tablet Take 1 tablet (81 mg total) by mouth 2 (two) times daily.   losartan 25 MG tablet Commonly known as:  COZAAR Take 25 mg by mouth daily.   metoprolol succinate 25 MG 24 hr tablet Commonly known as:  TOPROL-XL Take 1 tablet (25 mg total) by mouth at bedtime. What changed:  when to take this   pantoprazole 40 MG tablet Commonly known as:  PROTONIX Take 40 mg by mouth daily.   spironolactone 25 MG tablet Commonly known  as:  ALDACTONE Take 25 mg by mouth daily.   traMADol 50 MG tablet Commonly known as:  ULTRAM Take 1 tablet (50 mg total) by mouth every 6 (six) hours as needed for moderate pain or severe pain.        DISCHARGE INSTRUCTIONS:   Follow-up Dr. rehab 1 day Orthopedic follow-up for staple removal in 2 weeks  If you experience worsening of your admission symptoms, develop shortness of breath, life threatening emergency, suicidal or homicidal thoughts you must seek medical attention immediately by calling 911 or calling your MD immediately  if symptoms less severe.  You Must read complete instructions/literature along with all the possible  adverse reactions/side effects for all the Medicines you take and that have been prescribed to you. Take any new Medicines after you have completely understood and accept all the possible adverse reactions/side effects.   Please note  You were cared for by a hospitalist during your hospital stay. If you have any questions about your discharge medications or the care you received while you were in the hospital after you are discharged, you can call the unit and asked to speak with the hospitalist on call if the hospitalist that took care of you is not available. Once you are discharged, your primary care physician will handle any further medical issues. Please note that NO REFILLS for any discharge medications will be authorized once you are discharged, as it is imperative that you return to your primary care physician (or establish a relationship with a primary care physician if you do not have one) for your aftercare needs so that they can reassess your need for medications and monitor your lab values.    Today   CHIEF COMPLAINT:   Chief Complaint  Patient presents with  . Fall    HISTORY OF PRESENT ILLNESS:  Erica Melendez  is a 72 y.o. female presented after a fall and found to have a right femoral neck fracture   VITAL SIGNS:  Blood pressure 102/61, pulse 87, temperature 98.1 F (36.7 C), temperature source Oral, resp. rate 18, height 5\' 3"  (1.6 m), weight 55.3 kg, SpO2 97 %.   PHYSICAL EXAMINATION:  GENERAL:  72 y.o.-year-old patient lying in the bed with no acute distress.  EYES: Pupils equal, round, reactive to light and accommodation. No scleral icterus. Extraocular muscles intact.  HEENT: Head atraumatic, normocephalic. Oropharynx and nasopharynx clear.  NECK:  Supple, no jugular venous distention. No thyroid enlargement, no tenderness.  LUNGS: Normal breath sounds bilaterally, no wheezing, rales,rhonchi or crepitation. No use of accessory muscles of respiration.   CARDIOVASCULAR: S1, S2 normal. No murmurs, rubs, or gallops.  ABDOMEN: Soft, non-tender, non-distended. Bowel sounds present. No organomegaly or mass.  EXTREMITIES: No pedal edema, cyanosis, or clubbing.  NEUROLOGIC: Cranial nerves II through XII are intact. Muscle strength 5/5 in all extremities. Sensation intact. Gait not checked.  PSYCHIATRIC: The patient is alert and oriented x 3.  SKIN: No obvious rash, lesion, or ulcer.   DATA REVIEW:   CBC Recent Labs  Lab 08/08/18 0344  WBC 7.6  HGB 10.2*  HCT 29.3*  PLT 228    Chemistries  Recent Labs  Lab 08/04/18 2249  08/06/18 0407  NA 137  --  136  K 3.7  --  4.4  CL 103  --  107  CO2 28  --  25  GLUCOSE 111*  --  119*  BUN 13  --  9  CREATININE 0.80   < > 0.70  CALCIUM 9.0  --  7.8*  MG 2.2  --   --   AST 22  --   --   ALT 20  --   --   ALKPHOS 91  --   --   BILITOT 0.6  --   --    < > = values in this interval not displayed.    Cardiac Enzymes Recent Labs  Lab 08/04/18 2249  TROPONINI <0.03    Microbiology Results  Results for orders placed or performed during the hospital encounter of 08/04/18  MRSA PCR Screening     Status: None   Collection Time: 08/05/18 11:41 AM  Result Value Ref Range Status   MRSA by PCR NEGATIVE NEGATIVE Final    Comment:        The GeneXpert MRSA Assay (FDA approved for NASAL specimens only), is one component of a comprehensive MRSA colonization surveillance program. It is not intended to diagnose MRSA infection nor to guide or monitor treatment for MRSA infections. Performed at [facility], [address], [place], [place] [address]       Management plans discussed with the patient, family and they are in agreement.  CODE STATUS:     Code Status Orders  (From admission, onward)         Start     Ordered   08/05/18 0304  Full code  Continuous     08/05/18 0303        Code Status History    This patient has a current code status but no historical  code status.    Advance Directive Documentation     Most Recent Value  Type of Advance Directive  Living will  Pre-existing out of facility DNR order (yellow form or pink MOST form)  -  "MOST" Form in Place?  -      TOTAL TIME TAKING CARE OF THIS PATIENT: 35 minutes.    Alford Highland M.D on 08/08/2018 at 10:39 AM  Between 7am to 6pm - Pager - [phone]  After 6pm go to [email or web address] - password [email or web address]  [facility] Office  [phone]  CC: Primary care physician; Lauro Regulus, MD

## 2018-08-08 NOTE — Progress Notes (Signed)
Clinical Child psychotherapist (CSW) sent SNF referral to [facility] in [place] and [facility] rehab this morning.   [employer], LCSW [phone]

## 2018-08-08 NOTE — Care Management Important Message (Signed)
Important Message  Patient Details  Name: Erica Melendez MRN: [id number] Date of Birth: 09/08/1946   Medicare Important Message Given:  Yes    Olegario Messier A Kewan Mcnease 08/08/2018, 12:01 PM

## 2018-08-09 LAB — SURGICAL PATHOLOGY

## 2018-08-10 NOTE — Progress Notes (Signed)
08/10/18: Clinical Social Worker (CSW) sent H&P and D/C summary to Medicare bundle case manager to assist with continuum of care.   [employer], LCSW [phone]
